# Patient Record
Sex: Male | Born: 1965
Health system: Southern US, Community
[De-identification: ages and names within clinical notes are randomized; demographics above are authoritative.]

## PROBLEM LIST (undated history)

## (undated) DIAGNOSIS — M1990 Unspecified osteoarthritis, unspecified site: Secondary | ICD-10-CM

## (undated) DIAGNOSIS — K219 Gastro-esophageal reflux disease without esophagitis: Secondary | ICD-10-CM

## (undated) DIAGNOSIS — IMO0002 Reserved for concepts with insufficient information to code with codable children: Secondary | ICD-10-CM

## (undated) DIAGNOSIS — R0602 Shortness of breath: Secondary | ICD-10-CM

## (undated) DIAGNOSIS — I10 Essential (primary) hypertension: Secondary | ICD-10-CM

## (undated) DIAGNOSIS — G4730 Sleep apnea, unspecified: Secondary | ICD-10-CM

## (undated) DIAGNOSIS — T8859XA Other complications of anesthesia, initial encounter: Secondary | ICD-10-CM

## (undated) DIAGNOSIS — T4145XA Adverse effect of unspecified anesthetic, initial encounter: Secondary | ICD-10-CM

## (undated) HISTORY — PX: HERNIA REPAIR: SHX51

## (undated) HISTORY — PX: OTHER SURGICAL HISTORY: SHX169

## (undated) HISTORY — PX: LUMBAR SPINE SURGERY: SHX701

## (undated) HISTORY — PX: PELVIC ABCESS DRAINAGE: SHX2189

## (undated) HISTORY — PX: TONSILLECTOMY: SUR1361

---

## 2001-05-02 ENCOUNTER — Ambulatory Visit (HOSPITAL_COMMUNITY): Admission: RE | Admit: 2001-05-02 | Discharge: 2001-05-02 | Payer: Self-pay | Admitting: General Surgery

## 2012-01-18 ENCOUNTER — Other Ambulatory Visit (HOSPITAL_COMMUNITY): Payer: Self-pay | Admitting: Internal Medicine

## 2012-01-18 DIAGNOSIS — R1013 Epigastric pain: Secondary | ICD-10-CM

## 2012-01-20 ENCOUNTER — Other Ambulatory Visit (HOSPITAL_COMMUNITY): Payer: Self-pay

## 2012-01-25 ENCOUNTER — Ambulatory Visit (HOSPITAL_COMMUNITY)
Admission: RE | Admit: 2012-01-25 | Discharge: 2012-01-25 | Disposition: A | Discharge status: Home / Self Care | Payer: PRIVATE HEALTH INSURANCE | Source: Ambulatory Visit | Attending: Internal Medicine | Admitting: Internal Medicine

## 2012-01-25 DIAGNOSIS — R1013 Epigastric pain: Secondary | ICD-10-CM

## 2012-01-25 DIAGNOSIS — K838 Other specified diseases of biliary tract: Secondary | ICD-10-CM | POA: Insufficient documentation

## 2012-02-16 ENCOUNTER — Other Ambulatory Visit (HOSPITAL_COMMUNITY): Payer: Self-pay | Admitting: General Surgery

## 2012-02-16 DIAGNOSIS — R110 Nausea: Secondary | ICD-10-CM

## 2012-02-16 DIAGNOSIS — K828 Other specified diseases of gallbladder: Secondary | ICD-10-CM

## 2012-02-18 ENCOUNTER — Encounter (HOSPITAL_COMMUNITY): Payer: Self-pay

## 2012-02-18 ENCOUNTER — Encounter (HOSPITAL_COMMUNITY)
Admission: RE | Admit: 2012-02-18 | Discharge: 2012-02-18 | Disposition: A | Discharge status: Home / Self Care | Payer: PRIVATE HEALTH INSURANCE | Source: Ambulatory Visit | Attending: General Surgery | Admitting: General Surgery

## 2012-02-18 DIAGNOSIS — R110 Nausea: Secondary | ICD-10-CM | POA: Insufficient documentation

## 2012-02-18 DIAGNOSIS — K828 Other specified diseases of gallbladder: Secondary | ICD-10-CM

## 2012-02-18 DIAGNOSIS — K838 Other specified diseases of biliary tract: Secondary | ICD-10-CM | POA: Insufficient documentation

## 2012-02-18 MED ORDER — TECHNETIUM TC 99M MEBROFENIN IV KIT
5.0000 | PACK | Freq: Once | INTRAVENOUS | Status: AC | PRN
  Administered 2012-02-18: 5.4 via INTRAVENOUS

## 2012-02-18 MED ORDER — SINCALIDE 5 MCG IJ SOLR
INTRAMUSCULAR | Status: AC
  Administered 2012-02-18: 1.97 ug
  Filled 2012-02-18: qty 5

## 2012-06-01 ENCOUNTER — Other Ambulatory Visit: Payer: Self-pay | Admitting: Internal Medicine

## 2012-06-01 ENCOUNTER — Ambulatory Visit
Admission: RE | Admit: 2012-06-01 | Discharge: 2012-06-01 | Disposition: A | Discharge status: Home / Self Care | Payer: Worker's Compensation | Source: Ambulatory Visit | Attending: Internal Medicine | Admitting: Internal Medicine

## 2012-06-01 ENCOUNTER — Ambulatory Visit
Admission: RE | Admit: 2012-06-01 | Discharge: 2012-06-01 | Disposition: A | Discharge status: Home / Self Care | Payer: PRIVATE HEALTH INSURANCE | Source: Ambulatory Visit | Attending: Internal Medicine | Admitting: Internal Medicine

## 2012-06-01 DIAGNOSIS — M549 Dorsalgia, unspecified: Secondary | ICD-10-CM

## 2012-08-24 ENCOUNTER — Encounter (HOSPITAL_COMMUNITY): Payer: Self-pay | Admitting: Emergency Medicine

## 2012-08-24 ENCOUNTER — Emergency Department (HOSPITAL_COMMUNITY)
Admission: EM | Admit: 2012-08-24 | Discharge: 2012-08-25 | Disposition: A | Discharge status: Home / Self Care | Payer: PRIVATE HEALTH INSURANCE | Attending: Emergency Medicine | Admitting: Emergency Medicine

## 2012-08-24 DIAGNOSIS — R1013 Epigastric pain: Secondary | ICD-10-CM | POA: Insufficient documentation

## 2012-08-24 DIAGNOSIS — F17200 Nicotine dependence, unspecified, uncomplicated: Secondary | ICD-10-CM | POA: Insufficient documentation

## 2012-08-24 DIAGNOSIS — R42 Dizziness and giddiness: Secondary | ICD-10-CM | POA: Insufficient documentation

## 2012-08-24 HISTORY — DX: Reserved for concepts with insufficient information to code with codable children: IMO0002

## 2012-08-24 NOTE — ED Notes (Signed)
Patient states he may be having an allergic reaction to something; states he feels like his body is burning/flushing all over.  States has had this intermittently for about a year.

## 2012-08-24 NOTE — ED Notes (Signed)
States the epigastric pain did not begin until about one hour ago.  Came for tx of general head to toe flush, now having epigastric discomfort.  States he had a gall bladder work up by his doctor, as he has this type abdominal pain every few months but his Dr told him his gall bladder looked ok.   Has made frequent trips to bathroom because he drank about 50 ounces of water prior to arrival and keeps having to void.

## 2012-08-25 ENCOUNTER — Emergency Department (HOSPITAL_COMMUNITY): Payer: PRIVATE HEALTH INSURANCE

## 2012-08-25 LAB — CBC WITH DIFFERENTIAL/PLATELET
Basophils Relative: 0 % (ref 0–1)
Eosinophils Absolute: 0.1 10*3/uL (ref 0.0–0.7)
Eosinophils Relative: 0 % (ref 0–5)
Lymphs Abs: 3.5 10*3/uL (ref 0.7–4.0)
MCH: 30.5 pg (ref 26.0–34.0)
MCHC: 35.2 g/dL (ref 30.0–36.0)
MCV: 86.6 fL (ref 78.0–100.0)
Monocytes Relative: 4 % (ref 3–12)
Neutrophils Relative %: 66 % (ref 43–77)
Platelets: 203 10*3/uL (ref 150–400)

## 2012-08-25 LAB — COMPREHENSIVE METABOLIC PANEL
ALT: 23 U/L (ref 0–53)
Alkaline Phosphatase: 85 U/L (ref 39–117)
BUN: 16 mg/dL (ref 6–23)
CO2: 27 mEq/L (ref 19–32)
Chloride: 104 mEq/L (ref 96–112)
GFR calc Af Amer: >90 mL/min (ref >90)
Glucose, Bld: 106 mg/dL — ABNORMAL HIGH (ref 70–99)
Potassium: 4.1 mEq/L (ref 3.5–5.1)
Sodium: 138 mEq/L (ref 135–145)
Total Bilirubin: 0.3 mg/dL (ref 0.3–1.2)

## 2012-08-25 LAB — TROPONIN I: Troponin I: <0.3 ng/mL (ref <0.30)

## 2012-08-25 LAB — LIPASE, BLOOD: Lipase: 24 U/L (ref 11–59)

## 2012-08-25 MED ORDER — SODIUM CHLORIDE 0.9 % IV BOLUS (SEPSIS)
250.0000 mL | Freq: Once | INTRAVENOUS | Status: AC
  Administered 2012-08-25: 250 mL via INTRAVENOUS

## 2012-08-25 MED ORDER — ONDANSETRON HCL 4 MG/2ML IJ SOLN
4.0000 mg | Freq: Once | INTRAMUSCULAR | Status: AC
  Administered 2012-08-25: 4 mg via INTRAVENOUS
  Filled 2012-08-25: qty 2

## 2012-08-25 MED ORDER — HYDROMORPHONE HCL PF 1 MG/ML IJ SOLN
1.0000 mg | Freq: Once | INTRAMUSCULAR | Status: AC
  Administered 2012-08-25: 1 mg via INTRAVENOUS
  Filled 2012-08-25: qty 1

## 2012-08-25 MED ORDER — ASPIRIN 81 MG PO CHEW
324.0000 mg | CHEWABLE_TABLET | Freq: Once | ORAL | Status: AC
  Administered 2012-08-25: 324 mg via ORAL
  Filled 2012-08-25: qty 4

## 2012-08-25 MED ORDER — SODIUM CHLORIDE 0.9 % IV SOLN: INTRAVENOUS | Status: DC

## 2012-08-25 NOTE — ED Provider Notes (Signed)
History     CSN: [id number]  Arrival date & time 08/24/12  2219   First MD Initiated Contact with Patient 08/25/12 0031      Chief Complaint  Patient presents with  . Illness    (Consider location/radiation/quality/duration/timing/severity/associated sxs/prior treatment) The history is provided by the patient.  patient is a 46 year old male that presents with onset at 43 PM of a warm flushing sensation epigastric abdominal pain some lightheadedness or dizziness. Patient now feeling better but still has some epigastric discomfort. Patient's had an extensive workup in the past for gallbladder disease which was negative for gallstones. Currently epigastric abdominal pain is about a 3-4/10 described as kind of a soreness.  Past Medical History  Diagnosis Date  . Bulging disc     Past Surgical History  Procedure Date  . Hernia repair     No family history on file.  History  Substance Use Topics  . Smoking status: Current Everyday Smoker  . Smokeless tobacco: Not on file  . Alcohol Use: No      Review of Systems  Constitutional: Negative for fever.  HENT: Negative for neck pain.   Respiratory: Negative for shortness of breath.   Cardiovascular: Positive for chest pain.  Gastrointestinal: Positive for abdominal pain. Negative for nausea, vomiting and diarrhea.  Genitourinary: Negative for hematuria.  Musculoskeletal: Negative for back pain.  Skin: Negative for rash.  Neurological: Positive for dizziness and light-headedness. Negative for speech difficulty, weakness, numbness and headaches.  Hematological: Does not bruise/bleed easily.    Allergies  Review of patient's allergies indicates no known allergies.  Home Medications   Current Outpatient Rx  Name Route Sig Dispense Refill  . AMLODIPINE BESYLATE 5 MG PO TABS Oral Take 5 mg by mouth daily.    . ASPIRIN 325 MG PO TABS Oral Take 325 mg by mouth daily.    Marland Kitchen ESOMEPRAZOLE MAGNESIUM 40 MG PO CPDR Oral Take 40 mg by  mouth daily before breakfast.    . HYDROCODONE-ACETAMINOPHEN 10-325 MG PO TABS Oral Take 1 tablet by mouth every 6 (six) hours as needed. For pain    . FISH OIL BURP-LESS PO Oral Take 1,400 mg by mouth 3 (three) times daily.      BP 137/75  Pulse 89  Temp 97.6 F (36.4 C) (Oral)  Resp 18  Ht 5\' 10"  (1.778 m)  Wt 215 lb (97.523 kg)  BMI 30.85 kg/m2  SpO2 95%  Physical Exam  Nursing note and vitals reviewed. Constitutional: He is oriented to person, place, and time. He appears well-developed and well-nourished.  HENT:  Head: Normocephalic and atraumatic.  Eyes: Conjunctivae and EOM are normal. Pupils are equal, round, and reactive to light.  Neck: Normal range of motion. Neck supple.  Cardiovascular: Normal rate, regular rhythm and normal heart sounds.   No murmur heard. Pulmonary/Chest: Effort normal and breath sounds normal.  Abdominal: Soft. Bowel sounds are normal. There is no tenderness.  Musculoskeletal: Normal range of motion. He exhibits no edema and no tenderness.  Neurological: He is alert and oriented to person, place, and time. No cranial nerve deficit. He exhibits normal muscle tone. Coordination normal.  Skin: No rash noted.    ED Course  Procedures (including critical care time)  Labs Reviewed  COMPREHENSIVE METABOLIC PANEL - Abnormal; Notable for the following:    Glucose, Bld 106 (*)     All other components within normal limits  CBC WITH DIFFERENTIAL - Abnormal; Notable for the following:    WBC  11.9 (*)     Neutro Abs 7.9 (*)     All other components within normal limits  LIPASE, BLOOD  TROPONIN I  TROPONIN I   Dg Chest 2 View  08/25/2012  *RADIOLOGY REPORT*  Clinical Data: Epigastric pain with flushing.  CHEST - 2 VIEW  Comparison: None.  Findings: There is mild motion artifact on the lateral view. The heart size and mediastinal contours are normal. The lungs are clear. There is no pleural effusion or pneumothorax. No acute osseous findings are  identified.  IMPRESSION: No active cardiopulmonary process.   Original Report Authenticated By: Gerrianne Scale, M.D.     Date: 08/25/2012  Rate: 75  Rhythm: normal sinus rhythm  QRS Axis: normal  Intervals: normal  ST/T Wave abnormalities: normal  Conduction Disutrbances:none  Narrative Interpretation:   Old EKG Reviewed: none available  Results for orders placed during the hospital encounter of 08/24/12  COMPREHENSIVE METABOLIC PANEL      Component Value Range   Sodium 138  135 - 145 mEq/L   Potassium 4.1  3.5 - 5.1 mEq/L   Chloride 104  96 - 112 mEq/L   CO2 27  19 - 32 mEq/L   Glucose, Bld 106 (*) 70 - 99 mg/dL   BUN 16  6 - 23 mg/dL   Creatinine, Ser 1.91  0.50 - 1.35 mg/dL   Calcium 9.8  8.4 - 47.8 mg/dL   Total Protein 7.2  6.0 - 8.3 g/dL   Albumin 4.0  3.5 - 5.2 g/dL   AST 17  0 - 37 U/L   ALT 23  0 - 53 U/L   Alkaline Phosphatase 85  39 - 117 U/L   Total Bilirubin 0.3  0.3 - 1.2 mg/dL   GFR calc non Af Amer >90  >90 mL/min   GFR calc Af Amer >90  >90 mL/min  LIPASE, BLOOD      Component Value Range   Lipase 24  11 - 59 U/L  TROPONIN I      Component Value Range   Troponin I <0.30  <0.30 ng/mL  CBC WITH DIFFERENTIAL      Component Value Range   WBC 11.9 (*) 4.0 - 10.5 K/uL   RBC 4.79  4.22 - 5.81 MIL/uL   Hemoglobin 14.6  13.0 - 17.0 g/dL   HCT 29.5  62.1 - 30.8 %   MCV 86.6  78.0 - 100.0 fL   MCH 30.5  26.0 - 34.0 pg   MCHC 35.2  30.0 - 36.0 g/dL   RDW 65.7  84.6 - 96.2 %   Platelets 203  150 - 400 K/uL   Neutrophils Relative 66  43 - 77 %   Neutro Abs 7.9 (*) 1.7 - 7.7 K/uL   Lymphocytes Relative 29  12 - 46 %   Lymphs Abs 3.5  0.7 - 4.0 K/uL   Monocytes Relative 4  3 - 12 %   Monocytes Absolute 0.5  0.1 - 1.0 K/uL   Eosinophils Relative 0  0 - 5 %   Eosinophils Absolute 0.1  0.0 - 0.7 K/uL   Basophils Relative 0  0 - 1 %   Basophils Absolute 0.0  0.0 - 0.1 K/uL  TROPONIN I      Component Value Range   Troponin I <0.30  <0.30 ng/mL     1.  Epigastric abdominal pain       MDM  Patient presented with the flushing sensation located dizziness epigastric abdominal  pain started about 9:00 PM. Symptoms were improving upon arrival to the emergency department. Workup negative EKG without acute changes chest x-ray without pneumonia or pneumothorax troponin is x2 not consistent with an acute cardiac event no significant liver function tests and lipase was not consistent with pancreatitis. Patient had similar events in the past with pretty extensive gallbladder workup with no specific findings for gallstones then no evidence today of acute cholecystitis taste on exam or labs since she's had negative gallstone workup in the past unlikely that this gallstones now.   Patient will followup with his primary care Dr.        Shelda Jakes, MD 08/25/12 [phone]

## 2012-08-29 ENCOUNTER — Other Ambulatory Visit (HOSPITAL_COMMUNITY): Payer: Self-pay | Admitting: Internal Medicine

## 2012-08-29 DIAGNOSIS — R1013 Epigastric pain: Secondary | ICD-10-CM

## 2012-08-31 ENCOUNTER — Other Ambulatory Visit (HOSPITAL_COMMUNITY): Payer: Self-pay

## 2012-09-01 ENCOUNTER — Ambulatory Visit (HOSPITAL_COMMUNITY): Admission: RE | Admit: 2012-09-01 | Payer: PRIVATE HEALTH INSURANCE | Source: Ambulatory Visit

## 2012-09-09 ENCOUNTER — Ambulatory Visit (HOSPITAL_COMMUNITY)
Admission: RE | Admit: 2012-09-09 | Discharge: 2012-09-09 | Disposition: A | Discharge status: Home / Self Care | Payer: PRIVATE HEALTH INSURANCE | Source: Ambulatory Visit | Attending: Internal Medicine | Admitting: Internal Medicine

## 2012-09-09 ENCOUNTER — Other Ambulatory Visit (HOSPITAL_COMMUNITY): Payer: Self-pay

## 2012-09-09 ENCOUNTER — Encounter (HOSPITAL_COMMUNITY): Payer: Self-pay

## 2012-09-09 DIAGNOSIS — R1013 Epigastric pain: Secondary | ICD-10-CM

## 2012-09-09 MED ORDER — IOHEXOL 300 MG/ML  SOLN
100.0000 mL | Freq: Once | INTRAMUSCULAR | Status: AC | PRN
  Administered 2012-09-09: 100 mL via INTRAVENOUS

## 2012-09-29 ENCOUNTER — Encounter (HOSPITAL_COMMUNITY): Payer: Self-pay | Admitting: Pharmacy Technician

## 2012-09-29 NOTE — H&P (Signed)
  NTS SOAP Note  Vital Signs:  Vitals as of: 09/29/2012: Systolic 147: Diastolic 80: Heart Rate 84: Temp 98.59F: Height 16ft 10in: Weight 205Lbs 0 Ounces: Pain Level 8: BMI 29  BMI : 29.41 kg/m2  Subjective: This 46 Years 52 Months old Male presents for of epigastric pain.  Has been present intermittently for the past month.  Is spontaneous, comes and goes.  Difficult to know if it is associated with anything food.  + nausea.  Has had CT scan of abdomen (negative), HIDA which was negative, U/S of gallbladder which shows sludge.  ER labs unremarkable.  Review of Symptoms:  Constitutional:  fever,chills Head:unremarkable    Eyes:  blurred vision bilateral Nose/Mouth/Throat:unremarkable Cardiovascular:  unremarkable   Respiratory:  dyspnea Gastrointestin    abdominal pain,nausea,heartburn,dyspepsia Genitourinary:unremarkable       joint, back, neck pain Skin:unremarkable Hematolgic/Lymphatic:unremarkable     Allergic/Immunologic:unremarkable     Past Medical History:    Reviewed   Past Medical History  Surgical History: ingunal herniorrhaphy Medical Problems: HTN, reflux Allergies: nkda Medications: nexium, amlodipine, hydrocodone   Social History:Reviewed  Social History  Preferred Language: English ([place]) Race:  White Ethnicity: Not Hispanic / Latino Age: 46 Years 7 Months Marital Status:  M Alcohol: unknown Recreational drug(s):  No   Smoking Status: Current every day smoker reviewed on 09/29/2012 Started Date: 12/21/1981 Packs per day: 2.00   Family History:  Reviewed   Family History  Is there a family history UJ:WJXBJY, CAD, DM    Objective Information: General:  Well appearing, well nourished in no distress.   no scleral icterus Neck:  Supple without lymphadenopathy.  Heart:  RRR, no murmur or gallop.  Normal S1, S2.  No S3, S4.  Lungs:    CTA bilaterally, no wheezes, rhonchi,  rales.  Breathing unlabored. Abdomen:Soft, NT/ND, no HSM, no masses.  Assessment:Epigastric pain of unknown etiology  Diagnosis &amp; Procedure: DiagnosisCode: 789.06, ProcedureCode: 78295,    Plan:Scheduled for EGD on 10/04/12.  If this is negative, may need cholecystectomy.  Patient understands and agrees to treatment plan.   Patient Education:Alternative treatments to surgery were discussed with patient (and family).  Risks and benefits  of procedure were fully explained to the patient (and family) who gave informed consent. Patient/family questions were addressed.  Follow-up:Pending Surgery

## 2012-10-03 MED ORDER — SODIUM CHLORIDE 0.9 % IV SOLN: INTRAVENOUS | Status: DC

## 2012-10-03 MED ORDER — SODIUM CHLORIDE 0.45 % IV SOLN
INTRAVENOUS | Status: DC
  Administered 2012-10-04: 09:00:00 via INTRAVENOUS

## 2012-10-04 ENCOUNTER — Ambulatory Visit (HOSPITAL_COMMUNITY)
Admission: RE | Admit: 2012-10-04 | Discharge: 2012-10-04 | Disposition: A | Discharge status: Home / Self Care | Payer: PRIVATE HEALTH INSURANCE | Source: Ambulatory Visit | Attending: General Surgery | Admitting: General Surgery

## 2012-10-04 ENCOUNTER — Encounter (HOSPITAL_COMMUNITY): Payer: Self-pay | Admitting: *Deleted

## 2012-10-04 ENCOUNTER — Encounter (HOSPITAL_COMMUNITY)
Admission: RE | Disposition: A | Discharge status: Home / Self Care | Payer: Self-pay | Source: Ambulatory Visit | Attending: General Surgery

## 2012-10-04 DIAGNOSIS — R1013 Epigastric pain: Secondary | ICD-10-CM | POA: Insufficient documentation

## 2012-10-04 DIAGNOSIS — K3189 Other diseases of stomach and duodenum: Secondary | ICD-10-CM | POA: Insufficient documentation

## 2012-10-04 DIAGNOSIS — K2990 Gastroduodenitis, unspecified, without bleeding: Secondary | ICD-10-CM | POA: Insufficient documentation

## 2012-10-04 DIAGNOSIS — K2970 Gastritis, unspecified, without bleeding: Secondary | ICD-10-CM | POA: Insufficient documentation

## 2012-10-04 DIAGNOSIS — I10 Essential (primary) hypertension: Secondary | ICD-10-CM | POA: Insufficient documentation

## 2012-10-04 HISTORY — DX: Unspecified osteoarthritis, unspecified site: M19.90

## 2012-10-04 HISTORY — DX: Shortness of breath: R06.02

## 2012-10-04 HISTORY — DX: Essential (primary) hypertension: I10

## 2012-10-04 HISTORY — PX: ESOPHAGOGASTRODUODENOSCOPY: SHX5428

## 2012-10-04 SURGERY — EGD (ESOPHAGOGASTRODUODENOSCOPY)
Anesthesia: Moderate Sedation

## 2012-10-04 MED ORDER — STERILE WATER FOR IRRIGATION IR SOLN
Status: DC | PRN
  Administered 2012-10-04: 09:00:00

## 2012-10-04 MED ORDER — MIDAZOLAM HCL 5 MG/5ML IJ SOLN
INTRAMUSCULAR | Status: DC | PRN
  Administered 2012-10-04: 1 mg via INTRAVENOUS
  Administered 2012-10-04: 3 mg via INTRAVENOUS

## 2012-10-04 MED ORDER — MEPERIDINE HCL 100 MG/ML IJ SOLN
INTRAMUSCULAR | Status: AC
  Filled 2012-10-04: qty 1

## 2012-10-04 MED ORDER — BUTAMBEN-TETRACAINE-BENZOCAINE 2-2-14 % EX AERO
INHALATION_SPRAY | CUTANEOUS | Status: DC | PRN
  Administered 2012-10-04: 2 via TOPICAL

## 2012-10-04 MED ORDER — MEPERIDINE HCL 25 MG/ML IJ SOLN
INTRAMUSCULAR | Status: DC | PRN
  Administered 2012-10-04: 50 mg via INTRAVENOUS

## 2012-10-04 MED ORDER — MIDAZOLAM HCL 5 MG/5ML IJ SOLN
INTRAMUSCULAR | Status: AC
  Filled 2012-10-04: qty 5

## 2012-10-04 NOTE — Op Note (Signed)
[facility] [address] [place], [address]   ENDOSCOPY PROCEDURE REPORT  PATIENT: Tony Meadows, Tony Meadows  MR#: [id number] BIRTHDATE: 10/06/1966 , 46  yrs. old GENDER: Male ENDOSCOPIST: Franky Macho, MD REFERRED BY:  Artis Delay PROCEDURE DATE:  10/04/2012 PROCEDURE:  EGD w/ biopsy for H.pylori ASA CLASS:     Class II INDICATIONS:  dyspepsia. MEDICATIONS: Versed 4 mg IV and Demerol 50 mg IV TOPICAL ANESTHETIC: Cetacaine Spray  DESCRIPTION OF PROCEDURE: After the risks benefits and alternatives of the procedure were thoroughly explained, informed consent was obtained.  The EG-[id number] ([id number]) endoscope was introduced through the mouth and advanced to the second portion of the duodenum. Without limitations.  The instrument was slowly withdrawn as the mucosa was fully examined.    The scope was advanced to the second portion of the duodenum without difficulty.  First and second portions of the duodenum without limits.  The pylorus was noted to be widely patent.  There was evidence of streaking: Some gastritis in the prepyloric region. There was no gross ulcerations seen.  Prepyloric mucosal biopsy was taken for a CLOtest.  The stomach was easily distensible.  On retroflexion of the endoscope, no evidence of a hiatal hernia was noted.  The Z line was noted be a 40 cm from the teeth.  The esophagus was within normal limits.  No evidence of Barrett's esophagitis or stricture were noted.         The scope was then withdrawn from the patient and the procedure completed.  COMPLICATIONS: There were no complications. ENDOSCOPIC IMPRESSION:gastritis, moderate  RECOMMENDATIONS: CLOtest pending. Would continue PPI for now. REPEAT EXAM:  eSigned:  Franky Macho, MD 10/04/2012 9:37 AM   CC:

## 2012-10-04 NOTE — Interval H&P Note (Signed)
History and Physical Interval Note:  10/04/2012 9:18 AM  Tony Meadows  has presented today for surgery, with the diagnosis of Epigastric pain   The various methods of treatment have been discussed with the patient and family. After consideration of risks, benefits and other options for treatment, the patient has consented to  Procedure(s) (LRB) with comments: ESOPHAGOGASTRODUODENOSCOPY (EGD) (N/A) as a surgical intervention .  The patient's history has been reviewed, patient examined, no change in status, stable for surgery.  I have reviewed the patient's chart and labs.  Questions were answered to the patient's satisfaction.     Franky Macho A

## 2012-10-05 LAB — CLOTEST (H. PYLORI), BIOPSY: Helicobacter screen: NEGATIVE

## 2012-10-06 ENCOUNTER — Encounter (HOSPITAL_COMMUNITY): Payer: Self-pay | Admitting: Pharmacy Technician

## 2012-10-06 NOTE — H&P (Signed)
  NTS SOAP Note  Vital Signs:  Vitals as of: 09/29/2012: Systolic 147: Diastolic 80: Heart Rate 84: Temp 98.60F: Height 70ft 10in: Weight 205Lbs 0 Ounces: Pain Level 8: BMI 29  BMI : 29.41 kg/m2  Subjective: This 72 Years 109 Months old Male presents for of epigastric pain.  Has been present intermittently for the past month.  Is spontaneous, comes and goes.  Difficult to know if it is associated with anything food.  + nausea.  Has had CT scan of abdomen (negative), HIDA which was negative, U/S of gallbladder which shows sludge.  EGD showed only mild gastritis.  H. Pylori negative.  Review of Symptoms:  Constitutional:  fever,chills Head:unremarkable    Eyes:  blurred vision bilateral Nose/Mouth/Throat:unremarkable Cardiovascular:  unremarkable   Respiratory:  dyspnea Gastrointestin    abdominal pain,nausea,heartburn,dyspepsia Genitourinary:unremarkable       joint, back, neck pain Skin:unremarkable Hematolgic/Lymphatic:unremarkable     Allergic/Immunologic:unremarkable     Past Medical History:    Reviewed   Past Medical History  Surgical History: ingunal herniorrhaphy Medical Problems: HTN, reflux Allergies: nkda Medications: nexium, amlodipine, hydrocodone   Social History:Reviewed  Social History  Preferred Language: English ([place]) Race:  White Ethnicity: Not Hispanic / Latino Age: 12 Years 7 Months Marital Status:  M Alcohol: unknown Recreational drug(s):  No   Smoking Status: Current every day smoker reviewed on 09/29/2012 Started Date: 12/21/1981 Packs per day: 2.00   Family History:  Reviewed   Family History  Is there a family history EX:BMWUXL, CAD, DM    Objective Information: General:  Well appearing, well nourished in no distress.   no scleral icterus Neck:  Supple without lymphadenopathy.  Heart:  RRR, no murmur or gallop.  Normal S1, S2.  No S3, S4.  Lungs:    CTA  bilaterally, no wheezes, rhonchi, rales.  Breathing unlabored. Abdomen:Soft, NT/ND, no HSM, no masses.  Assessment:Epigastric pain of unknown etiology  Diagnosis &amp; Procedure:    Plan:Scheduled for Laparoscopic cholecystectomy on 10/12/12.  Patient Education:Alternative treatments to surgery were discussed with patient (and family).  Risks and benefits  of procedure were fully explained to the patient (and family) who gave informed consent. Patient/family questions were addressed.  Follow-up:Pending Surgery

## 2012-10-07 ENCOUNTER — Encounter (HOSPITAL_COMMUNITY): Payer: Self-pay

## 2012-10-07 ENCOUNTER — Encounter (HOSPITAL_COMMUNITY)
Admission: RE | Admit: 2012-10-07 | Discharge: 2012-10-07 | Disposition: A | Discharge status: Home / Self Care | Payer: PRIVATE HEALTH INSURANCE | Source: Ambulatory Visit | Attending: General Surgery | Admitting: General Surgery

## 2012-10-07 HISTORY — DX: Sleep apnea, unspecified: G47.30

## 2012-10-07 HISTORY — DX: Other complications of anesthesia, initial encounter: T88.59XA

## 2012-10-07 HISTORY — DX: Gastro-esophageal reflux disease without esophagitis: K21.9

## 2012-10-07 HISTORY — DX: Adverse effect of unspecified anesthetic, initial encounter: T41.45XA

## 2012-10-07 LAB — BASIC METABOLIC PANEL
BUN: 11 mg/dL (ref 6–23)
Chloride: 100 mEq/L (ref 96–112)
Creatinine, Ser: 0.8 mg/dL (ref 0.50–1.35)
GFR calc Af Amer: >90 mL/min (ref >90)
GFR calc non Af Amer: >90 mL/min (ref >90)
Potassium: 4.1 mEq/L (ref 3.5–5.1)

## 2012-10-07 LAB — HEMOGLOBIN AND HEMATOCRIT, BLOOD: Hemoglobin: 16 g/dL (ref 13.0–17.0)

## 2012-10-07 LAB — SURGICAL PCR SCREEN: Staphylococcus aureus: POSITIVE — AB

## 2012-10-07 NOTE — Patient Instructions (Signed)
18 Tony Meadows  10/07/2012   Your procedure is scheduled on:  Wednesday, 10/12/12  Report to [facility] at 0920 AM.  Call this number if you have problems the morning of surgery: [phone]   Remember:   Do not eat food:After Midnight.  May have clear liquids:until Midnight .  Clear liquids include soda, tea, black coffee, apple or grape juice, broth.  Take these medicines the morning of surgery with A SIP OF WATER: norvasc, nexium, norco   Do not wear jewelry, make-up or nail polish.  Do not wear lotions, powders, or perfumes. You may wear deodorant.  Do not shave 48 hours prior to surgery. Men may shave face and neck.  Do not bring valuables to the hospital.  Contacts, dentures or bridgework may not be worn into surgery.  Leave suitcase in the car. After surgery it may be brought to your room.  For patients admitted to the hospital, checkout time is 11:00 AM the day of discharge.   Patients discharged the day of surgery will not be allowed to drive home.  Name and phone number of your driver: family  Special Instructions: Shower using CHG 2 nights before surgery and the night before surgery.  If you shower the day of surgery use CHG.  Use special wash - you have one bottle of CHG for all showers.  You should use approximately 1/3 of the bottle for each shower.   Please read over the following fact sheets that you were given: Pain Booklet, Coughing and Deep Breathing, MRSA Information, Surgical Site Infection Prevention, Anesthesia Post-op Instructions and Care and Recovery After Surgery   Laparoscopic Cholecystectomy Care After These instructions give you information on caring for yourself after your procedure. Your doctor may also give you more specific instructions. Call your doctor if you have any problems or questions after your procedure. HOME CARE  Change your bandages (dressings) as told by your doctor.  Keep the wound dry and clean. Wash the wound gently with soap and  water. Pat the wound dry with a clean towel.  Do not take baths, swim, or use hot tubs for 10 days, or as told by your doctor.  Only take medicine as told by your doctor.  Eat a normal diet as told by your doctor.  Do not lift anything heavier than 25 pounds (11.5 kg), or as told by your doctor.  Do not play contact sports for 1 week, or as told by your doctor. GET HELP RIGHT AWAY IF:   Your wound is red, puffy (swollen), or painful.  You have yellowish-white fluid (pus) coming from the wound.  You have fluid draining from the wound for more than 1 day.  You have a bad smell coming from the wound.  Your wound breaks open.  You have a rash.  You have trouble breathing.  You have chest pain.  You have a bad reaction to your medicine.  You have a fever.  You have pain in the shoulders (shoulder strap areas).  You feel dizzy or pass out (faint).  You have severe belly (abdominal) pain.  You feel sick to your stomach (nauseous) or throw up (vomit) for more than 1 day. MAKE SURE YOU:  Understand these instructions.  Will watch your condition.  Will get help right away if you are not doing well or get worse. Document Released: 09/15/2008 Document Revised: 02/29/2012 Document Reviewed: 05/26/2011 [facility] Patient Information 2013 [place], [place]. Laparoscopic Cholecystectomy Laparoscopic cholecystectomy is surgery to remove the  gallbladder. The gallbladder is located slightly to the right of center in the abdomen, behind the liver. It is a concentrating and storage sac for the bile produced in the liver. Bile aids in the digestion and absorption of fats. Gallbladder disease (cholecystitis) is an inflammation of your gallbladder. This condition is usually caused by a buildup of gallstones (cholelithiasis) in your gallbladder. Gallstones can block the flow of bile, resulting in inflammation and pain. In severe cases, emergency surgery may be required. When emergency surgery is  not required, you will have time to prepare for the procedure. Laparoscopic surgery is an alternative to open surgery. Laparoscopic surgery usually has a shorter recovery time. Your common bile duct may also need to be examined and explored. Your caregiver will discuss this with you if he or she feels this should be done. If stones are found in the common bile duct, they may be removed. LET YOUR CAREGIVER KNOW ABOUT:  Allergies to food or medicine.  Medicines taken, including vitamins, herbs, eyedrops, over-the-counter medicines, and creams.  Use of steroids (by mouth or creams).  Previous problems with anesthetics or numbing medicines.  History of bleeding problems or blood clots.  Previous surgery.  Other health problems, including diabetes and kidney problems.  Possibility of pregnancy, if this applies. RISKS AND COMPLICATIONS All surgery is associated with risks. Some problems that may occur following this procedure include:  Infection.  Damage to the common bile duct, nerves, arteries, veins, or other internal organs such as the stomach or intestines.  Bleeding.  A stone may remain in the common bile duct. BEFORE THE PROCEDURE  Do not take aspirin for 3 days prior to surgery or blood thinners for 1 week prior to surgery.  Do not eat or drink anything after midnight the night before surgery.  Let your caregiver know if you develop a cold or other infectious problem prior to surgery.  You should be present 60 minutes before the procedure or as directed. PROCEDURE  You will be given medicine that makes you sleep (general anesthetic). When you are asleep, your surgeon will make several small cuts (incisions) in your abdomen. One of these incisions is used to insert a small, lighted scope (laparoscope) into the abdomen. The laparoscope helps the surgeon see into your abdomen. Carbon dioxide gas will be pumped into your abdomen. The gas allows more room for the surgeon to  perform your surgery. Other operating instruments are inserted through the other incisions. Laparoscopic procedures may not be appropriate when:  There is major scarring from previous surgery.  The gallbladder is extremely inflamed.  There are bleeding disorders or unexpected cirrhosis of the liver.  A pregnancy is near term.  Other conditions make the laparoscopic procedure impossible. If your surgeon feels it is not safe to continue with a laparoscopic procedure, he or she will perform an open abdominal procedure. In this case, the surgeon will make an incision to open the abdomen. This gives the surgeon a larger view and field to work within. This may allow the surgeon to perform procedures that sometimes cannot be performed with a laparoscope alone. Open surgery has a longer recovery time. AFTER THE PROCEDURE  You will be taken to the recovery area where a nurse will watch and check your progress.  You may be allowed to go home the same day.  Do not resume physical activities until directed by your caregiver.  You may resume a normal diet and activities as directed. Document Released: 12/07/2005 Document Revised:  02/29/2012 Document Reviewed: 05/22/2011 [employer] Patient Information 2013 [place], [place].

## 2012-10-07 NOTE — Progress Notes (Signed)
10/07/12 1040  OBSTRUCTIVE SLEEP APNEA  Have you ever been diagnosed with sleep apnea through a sleep study? No  Do you snore loudly (loud enough to be heard through closed doors)?  1  Do you often feel tired, fatigued, or sleepy during the daytime? 0  Has anyone observed you stop breathing during your sleep? 0  Do you have, or are you being treated for high blood pressure? 1  BMI more than 35 kg/m2? 0  Age over 48 years old? 0  Neck circumference greater than 40 cm/18 inches? 1  Gender: 1  Obstructive Sleep Apnea Score 4   Score 4 or greater  Results sent to PCP;No PCP

## 2012-10-12 ENCOUNTER — Ambulatory Visit (HOSPITAL_COMMUNITY): Payer: PRIVATE HEALTH INSURANCE | Admitting: Anesthesiology

## 2012-10-12 ENCOUNTER — Encounter (HOSPITAL_COMMUNITY)
Admission: RE | Disposition: A | Discharge status: Home / Self Care | Payer: Self-pay | Source: Ambulatory Visit | Attending: General Surgery

## 2012-10-12 ENCOUNTER — Encounter (HOSPITAL_COMMUNITY): Payer: Self-pay | Admitting: Anesthesiology

## 2012-10-12 ENCOUNTER — Encounter (HOSPITAL_COMMUNITY): Payer: Self-pay | Admitting: *Deleted

## 2012-10-12 ENCOUNTER — Ambulatory Visit (HOSPITAL_COMMUNITY)
Admission: RE | Admit: 2012-10-12 | Discharge: 2012-10-12 | Disposition: A | Discharge status: Home / Self Care | Payer: PRIVATE HEALTH INSURANCE | Source: Ambulatory Visit | Attending: General Surgery | Admitting: General Surgery

## 2012-10-12 DIAGNOSIS — K8020 Calculus of gallbladder without cholecystitis without obstruction: Secondary | ICD-10-CM | POA: Insufficient documentation

## 2012-10-12 DIAGNOSIS — I10 Essential (primary) hypertension: Secondary | ICD-10-CM | POA: Insufficient documentation

## 2012-10-12 DIAGNOSIS — Z01812 Encounter for preprocedural laboratory examination: Secondary | ICD-10-CM | POA: Insufficient documentation

## 2012-10-12 HISTORY — PX: CHOLECYSTECTOMY: SHX55

## 2012-10-12 SURGERY — LAPAROSCOPIC CHOLECYSTECTOMY
Anesthesia: General | Site: Abdomen | Wound class: Contaminated

## 2012-10-12 MED ORDER — ONDANSETRON HCL 4 MG/2ML IJ SOLN
INTRAMUSCULAR | Status: AC
  Filled 2012-10-12: qty 2

## 2012-10-12 MED ORDER — GLYCOPYRROLATE 0.2 MG/ML IJ SOLN
0.2000 mg | Freq: Once | INTRAMUSCULAR | Status: AC
  Administered 2012-10-12: 0.2 mg via INTRAVENOUS

## 2012-10-12 MED ORDER — ONDANSETRON HCL 4 MG/2ML IJ SOLN
4.0000 mg | Freq: Once | INTRAMUSCULAR | Status: AC | PRN
  Administered 2012-10-12: 4 mg via INTRAVENOUS

## 2012-10-12 MED ORDER — ONDANSETRON HCL 4 MG/2ML IJ SOLN
4.0000 mg | Freq: Once | INTRAMUSCULAR | Status: AC
  Administered 2012-10-12: 4 mg via INTRAVENOUS

## 2012-10-12 MED ORDER — GLYCOPYRROLATE 0.2 MG/ML IJ SOLN
INTRAMUSCULAR | Status: DC | PRN
  Administered 2012-10-12: 0.2 mg via INTRAVENOUS
  Administered 2012-10-12: 0.4 mg via INTRAVENOUS
  Administered 2012-10-12: 0.2 mg via INTRAVENOUS

## 2012-10-12 MED ORDER — ROCURONIUM BROMIDE 50 MG/5ML IV SOLN
INTRAVENOUS | Status: AC
  Filled 2012-10-12: qty 1

## 2012-10-12 MED ORDER — PROPOFOL 10 MG/ML IV EMUL
INTRAVENOUS | Status: AC
  Filled 2012-10-12: qty 20

## 2012-10-12 MED ORDER — SUCCINYLCHOLINE CHLORIDE 20 MG/ML IJ SOLN
INTRAMUSCULAR | Status: DC | PRN
  Administered 2012-10-12: 140 mg via INTRAVENOUS

## 2012-10-12 MED ORDER — CEFAZOLIN SODIUM-DEXTROSE 2-3 GM-% IV SOLR
2.0000 g | INTRAVENOUS | Status: AC
  Administered 2012-10-12: 2 g via INTRAVENOUS

## 2012-10-12 MED ORDER — LIDOCAINE HCL (CARDIAC) 10 MG/ML IV SOLN
INTRAVENOUS | Status: DC | PRN
  Administered 2012-10-12: 20 mg via INTRAVENOUS

## 2012-10-12 MED ORDER — BUPIVACAINE HCL (PF) 0.5 % IJ SOLN
INTRAMUSCULAR | Status: AC
  Filled 2012-10-12: qty 30

## 2012-10-12 MED ORDER — FENTANYL CITRATE 0.05 MG/ML IJ SOLN
INTRAMUSCULAR | Status: AC
  Filled 2012-10-12: qty 2

## 2012-10-12 MED ORDER — HYDROCODONE-ACETAMINOPHEN 5-325 MG PO TABS: 2.0000 | ORAL_TABLET | Freq: Once | ORAL | Status: DC

## 2012-10-12 MED ORDER — PROPOFOL 10 MG/ML IV BOLUS
INTRAVENOUS | Status: DC | PRN
  Administered 2012-10-12: 30 mg via INTRAVENOUS
  Administered 2012-10-12: 150 mg via INTRAVENOUS

## 2012-10-12 MED ORDER — MUPIROCIN 2 % EX OINT
TOPICAL_OINTMENT | CUTANEOUS | Status: AC
  Filled 2012-10-12: qty 22

## 2012-10-12 MED ORDER — FENTANYL CITRATE 0.05 MG/ML IJ SOLN
INTRAMUSCULAR | Status: AC
  Filled 2012-10-12: qty 5

## 2012-10-12 MED ORDER — CHLORHEXIDINE GLUCONATE 4 % EX LIQD: 1.0000 "application " | Freq: Once | CUTANEOUS | Status: DC

## 2012-10-12 MED ORDER — SODIUM CHLORIDE 0.9 % IR SOLN
Status: DC | PRN
  Administered 2012-10-12: 1000 mL

## 2012-10-12 MED ORDER — MIDAZOLAM HCL 2 MG/2ML IJ SOLN
INTRAMUSCULAR | Status: AC
  Filled 2012-10-12: qty 2

## 2012-10-12 MED ORDER — KETOROLAC TROMETHAMINE 30 MG/ML IJ SOLN
INTRAMUSCULAR | Status: AC
  Filled 2012-10-12: qty 1

## 2012-10-12 MED ORDER — HYDROCODONE-ACETAMINOPHEN 5-325 MG PO TABS
ORAL_TABLET | ORAL | Status: AC
  Filled 2012-10-12: qty 2

## 2012-10-12 MED ORDER — HEMOSTATIC AGENTS (NO CHARGE) OPTIME
TOPICAL | Status: DC | PRN
  Administered 2012-10-12: 1 via TOPICAL

## 2012-10-12 MED ORDER — CEFAZOLIN SODIUM-DEXTROSE 2-3 GM-% IV SOLR
INTRAVENOUS | Status: AC
  Filled 2012-10-12: qty 50

## 2012-10-12 MED ORDER — HYDROCODONE-ACETAMINOPHEN 10-325 MG PO TABS: 1.0000 | ORAL_TABLET | Freq: Four times a day (QID) | ORAL | Status: DC | PRN

## 2012-10-12 MED ORDER — GLYCOPYRROLATE 0.2 MG/ML IJ SOLN
INTRAMUSCULAR | Status: AC
  Filled 2012-10-12: qty 1

## 2012-10-12 MED ORDER — LIDOCAINE HCL (PF) 1 % IJ SOLN
INTRAMUSCULAR | Status: AC
  Filled 2012-10-12: qty 5

## 2012-10-12 MED ORDER — NEOSTIGMINE METHYLSULFATE 1 MG/ML IJ SOLN
INTRAMUSCULAR | Status: DC | PRN
  Administered 2012-10-12: 2 mg via INTRAVENOUS
  Administered 2012-10-12: 1 mg via INTRAVENOUS

## 2012-10-12 MED ORDER — ENOXAPARIN SODIUM 40 MG/0.4ML ~~LOC~~ SOLN
SUBCUTANEOUS | Status: AC
  Filled 2012-10-12: qty 0.4

## 2012-10-12 MED ORDER — ENOXAPARIN SODIUM 40 MG/0.4ML ~~LOC~~ SOLN
40.0000 mg | Freq: Once | SUBCUTANEOUS | Status: AC
  Administered 2012-10-12: 40 mg via SUBCUTANEOUS

## 2012-10-12 MED ORDER — FENTANYL CITRATE 0.05 MG/ML IJ SOLN
25.0000 ug | INTRAMUSCULAR | Status: DC | PRN
  Administered 2012-10-12 (×4): 50 ug via INTRAVENOUS

## 2012-10-12 MED ORDER — NEOSTIGMINE METHYLSULFATE 1 MG/ML IJ SOLN
INTRAMUSCULAR | Status: AC
  Filled 2012-10-12: qty 10

## 2012-10-12 MED ORDER — FENTANYL CITRATE 0.05 MG/ML IJ SOLN
INTRAMUSCULAR | Status: DC | PRN
  Administered 2012-10-12 (×5): 50 ug via INTRAVENOUS

## 2012-10-12 MED ORDER — MIDAZOLAM HCL 2 MG/2ML IJ SOLN
1.0000 mg | INTRAMUSCULAR | Status: DC | PRN
  Administered 2012-10-12 (×2): 2 mg via INTRAVENOUS

## 2012-10-12 MED ORDER — KETOROLAC TROMETHAMINE 30 MG/ML IJ SOLN
30.0000 mg | Freq: Once | INTRAMUSCULAR | Status: AC
  Administered 2012-10-12: 30 mg via INTRAVENOUS

## 2012-10-12 MED ORDER — LACTATED RINGERS IV SOLN
INTRAVENOUS | Status: DC
  Administered 2012-10-12: 1000 mL via INTRAVENOUS

## 2012-10-12 MED ORDER — SUCCINYLCHOLINE CHLORIDE 20 MG/ML IJ SOLN
INTRAMUSCULAR | Status: AC
  Filled 2012-10-12: qty 1

## 2012-10-12 MED ORDER — BUPIVACAINE HCL 0.5 % IJ SOLN
INTRAMUSCULAR | Status: DC | PRN
  Administered 2012-10-12: 10 mL

## 2012-10-12 MED ORDER — GLYCOPYRROLATE 0.2 MG/ML IJ SOLN
INTRAMUSCULAR | Status: AC
  Filled 2012-10-12: qty 3

## 2012-10-12 MED ORDER — MUPIROCIN 2 % EX OINT
TOPICAL_OINTMENT | Freq: Two times a day (BID) | CUTANEOUS | Status: DC
  Administered 2012-10-12: 1 via NASAL

## 2012-10-12 MED ORDER — ROCURONIUM BROMIDE 100 MG/10ML IV SOLN
INTRAVENOUS | Status: DC | PRN
  Administered 2012-10-12: 25 mg via INTRAVENOUS
  Administered 2012-10-12: 5 mg via INTRAVENOUS

## 2012-10-12 SURGICAL SUPPLY — 37 items
APPLIER CLIP ROT 10 11.4 M/L (STAPLE) ×2
APR CLP MED LRG 11.4X10 (STAPLE) ×1
BAG HAMPER (MISCELLANEOUS) ×2 IMPLANT
BAG SPEC RTRVL LRG 6X4 10 (ENDOMECHANICALS) ×1
CLIP APPLIE ROT 10 11.4 M/L (STAPLE) ×1 IMPLANT
CLOTH BEACON ORANGE TIMEOUT ST (SAFETY) ×2 IMPLANT
COVER LIGHT HANDLE STERIS (MISCELLANEOUS) ×4 IMPLANT
DECANTER SPIKE VIAL GLASS SM (MISCELLANEOUS) ×2 IMPLANT
DURAPREP 26ML APPLICATOR (WOUND CARE) ×2 IMPLANT
ELECT REM PT RETURN 9FT ADLT (ELECTROSURGICAL) ×2
ELECTRODE REM PT RTRN 9FT ADLT (ELECTROSURGICAL) ×1 IMPLANT
FILTER SMOKE EVAC LAPAROSHD (FILTER) ×2 IMPLANT
FORMALIN 10 PREFIL 120ML (MISCELLANEOUS) ×2 IMPLANT
GLOVE BIO SURGEON STRL SZ7.5 (GLOVE) ×2 IMPLANT
GLOVE BIOGEL PI IND STRL 7.0 (GLOVE) ×1 IMPLANT
GLOVE BIOGEL PI IND STRL 7.5 (GLOVE) ×1 IMPLANT
GLOVE BIOGEL PI INDICATOR 7.0 (GLOVE) ×1
GLOVE BIOGEL PI INDICATOR 7.5 (GLOVE) ×1
GLOVE ECLIPSE 6.5 STRL STRAW (GLOVE) ×2 IMPLANT
GLOVE ECLIPSE 7.0 STRL STRAW (GLOVE) ×2 IMPLANT
GOWN STRL REIN XL XLG (GOWN DISPOSABLE) ×6 IMPLANT
HEMOSTAT SNOW SURGICEL 2X4 (HEMOSTASIS) ×2 IMPLANT
INST SET LAPROSCOPIC AP (KITS) ×2 IMPLANT
KIT ROOM TURNOVER APOR (KITS) ×2 IMPLANT
KIT TROCAR LAP CHOLE (TROCAR) ×2 IMPLANT
MANIFOLD NEPTUNE II (INSTRUMENTS) ×2 IMPLANT
NS IRRIG 1000ML POUR BTL (IV SOLUTION) ×2 IMPLANT
PACK LAP CHOLE LZT030E (CUSTOM PROCEDURE TRAY) ×2 IMPLANT
PAD ARMBOARD 7.5X6 YLW CONV (MISCELLANEOUS) ×2 IMPLANT
POUCH SPECIMEN RETRIEVAL 10MM (ENDOMECHANICALS) ×2 IMPLANT
SET BASIN LINEN APH (SET/KITS/TRAYS/PACK) ×2 IMPLANT
SPONGE GAUZE 2X2 8PLY STRL LF (GAUZE/BANDAGES/DRESSINGS) ×8 IMPLANT
STAPLER VISISTAT (STAPLE) ×2 IMPLANT
SUT VICRYL 0 UR6 27IN ABS (SUTURE) ×2 IMPLANT
TAPE CLOTH SURG 4X10 WHT LF (GAUZE/BANDAGES/DRESSINGS) ×2 IMPLANT
WARMER LAPAROSCOPE (MISCELLANEOUS) ×2 IMPLANT
YANKAUER SUCT 12FT TUBE ARGYLE (SUCTIONS) ×2 IMPLANT

## 2012-10-12 NOTE — Transfer of Care (Signed)
Immediate Anesthesia Transfer of Care Note  Patient: Tony Meadows  Procedure(s) Performed: Procedure(s) (LRB): LAPAROSCOPIC CHOLECYSTECTOMY (N/A)  Patient Location: PACU  Anesthesia Type: General  Level of Consciousness: awake  Airway & Oxygen Therapy: Patient Spontanous Breathing and non-rebreather face mask  Post-op Assessment: Report given to PACU RN, Post -op Vital signs reviewed and stable and Patient moving all extremities  Post vital signs: Reviewed and stable  Complications: No apparent anesthesia complications

## 2012-10-12 NOTE — Anesthesia Postprocedure Evaluation (Signed)
Anesthesia Post Note  Patient: Tony Meadows  Procedure(s) Performed: Procedure(s) (LRB): LAPAROSCOPIC CHOLECYSTECTOMY (N/A)  Anesthesia type: General  Patient location: PACU  Post pain: Pain level controlled  Post assessment: Post-op Vital signs reviewed, Patient's Cardiovascular Status Stable, Respiratory Function Stable, Patent Airway, No signs of Nausea or vomiting and Pain level controlled  Last Vitals:  Filed Vitals:   10/12/12 1235  BP: 132/86  Pulse: 85  Temp: 36.5 C  Resp: 12    Post vital signs: Reviewed and stable  Level of consciousness: awake and alert   Complications: No apparent anesthesia complications

## 2012-10-12 NOTE — Interval H&P Note (Signed)
History and Physical Interval Note:  10/12/2012 10:58 AM  Tony Meadows  has presented today for surgery, with the diagnosis of Cholelithiasis  The various methods of treatment have been discussed with the patient and family. After consideration of risks, benefits and other options for treatment, the patient has consented to  Procedure(s) (LRB) with comments: LAPAROSCOPIC CHOLECYSTECTOMY (N/A) as a surgical intervention .  The patient's history has been reviewed, patient examined, no change in status, stable for surgery.  I have reviewed the patient's chart and labs.  Questions were answered to the patient's satisfaction.     Franky Macho A

## 2012-10-12 NOTE — Anesthesia Procedure Notes (Signed)
Procedure Name: Intubation Date/Time: 10/12/2012 11:50 AM Performed by: Franco Nones Pre-anesthesia Checklist: Patient identified, Patient being monitored, Timeout performed, Emergency Drugs available and Suction available Patient Re-evaluated:Patient Re-evaluated prior to inductionOxygen Delivery Method: Circle System Utilized Preoxygenation: Pre-oxygenation with 100% oxygen Intubation Type: IV induction, Rapid sequence and Cricoid Pressure applied Laryngoscope Size: Miller and 2 Grade View: Grade I Tube type: Oral Tube size: 7.0 mm Number of attempts: 1 Airway Equipment and Method: stylet Placement Confirmation: ETT inserted through vocal cords under direct vision,  positive ETCO2 and breath sounds checked- equal and bilateral Secured at: 21 cm Tube secured with: Tape Dental Injury: Teeth and Oropharynx as per pre-operative assessment

## 2012-10-12 NOTE — Anesthesia Preprocedure Evaluation (Signed)
Anesthesia Evaluation  Patient identified by MRN, date of birth, ID band Patient awake    Reviewed: Allergy & Precautions, H&P , NPO status , Patient's Chart, lab work & pertinent test results  Airway Mallampati: I TM Distance: >3 FB     Dental  (+) Edentulous Upper and Edentulous Lower   Pulmonary Current Smoker,  breath sounds clear to auscultation        Cardiovascular hypertension, Pt. on medications Rhythm:Regular Rate:Normal     Neuro/Psych    GI/Hepatic GERD-  Medicated and Controlled,  Endo/Other    Renal/GU      Musculoskeletal   Abdominal   Peds  Hematology   Anesthesia Other Findings   Reproductive/Obstetrics                           Anesthesia Physical Anesthesia Plan  ASA: II  Anesthesia Plan: General   Post-op Pain Management:    Induction: Intravenous, Rapid sequence and Cricoid pressure planned  Airway Management Planned: Oral ETT  Additional Equipment:   Intra-op Plan:   Post-operative Plan: Extubation in OR  Informed Consent: I have reviewed the patients History and Physical, chart, labs and discussed the procedure including the risks, benefits and alternatives for the proposed anesthesia with the patient or authorized representative who has indicated his/her understanding and acceptance.     Plan Discussed with:   Anesthesia Plan Comments:         Anesthesia Quick Evaluation

## 2012-10-12 NOTE — Op Note (Signed)
Patient:  Tony Meadows  DOB:  07/13/1966  MRN:  [id number]   Preop Diagnosis:  Biliary colic, cholelithiasis  Postop Diagnosis:  Same  Procedure: Laparoscopic Cholecystectomy  Surgeon:  Franky Macho, M.D.  Anes:  General endotracheal  Indications:  Patient is a 46 year old white male who presents with biliary colic secondary to biliary sludge. The risks and benefits of the procedure including bleeding, infection, hepatobiliary injury, the possibly of an open procedure were fully explained to the patient, gave informed consent.  Procedure note:  The patient was placed the supine position. After induction of general endotracheal anesthesia, the abdomen was prepped and draped using usual sterile technique with DuraPrep. Surgical site confirmation was performed.  A supraumbilical incision was made down to the fascia. A Veress needle was introduced into the abdominal cavity and confirmation of placement was done using the saline drop test. The abdomen was then insufflated to 16 mm mercury pressure. An 11 mm trocar was introduced into the abdominal cavity under direct visualization without difficulty. The patient was placed in reverse Trendelenburg position and additional 11 mm trocar was placed the epigastric region and 5 mm trochars were placed the right upper quadrant right flank regions. The liver was inspected and noted within normal limits. There were  multiple adhesions of omentum around the gallbladder. These were freed away both bluntly and sharply without difficulty. The gallbladder was retracted superiorly and laterally. Dissection was begun around the infundibulum of the gallbladder. The cystic duct was first identified. Its juncture to the infundibulum was fully identified. Endoclips were placed proximally and distally on the cystic duct and the cystic duct was divided. This was likewise done to the cystic artery. The gallbladder was then freed away from the gallbladder fossa using Bovie  electrocautery. The gallbladder was delivered through the epigastric trocar site using an Endo Catch bag. The gallbladder fossa was inspected and no abnormal bleeding or bile leakage was noted. Surgicel was placed in the gallbladder fossa. All fluid and air were then evacuated from the abdominal cavity prior to the removal of the trochars.  All wounds were gave normal saline. All wounds were injected with 0.5% Sensorcaine. The supraumbilical fascia as well as epigastric fascia reapproximated using 0 Vicryl interrupted sutures. All skin incisions were closed using staples. Betadine ointment and dressed a dressings were applied.  All tape and needle counts were correct at the end of the procedure. Patient was extubated in the operating room and went back to PACU in stable condition.  Complications:  None  EBL:  Minimal  Specimen:  Gallbladder

## 2012-10-17 ENCOUNTER — Encounter (HOSPITAL_COMMUNITY): Payer: Self-pay | Admitting: General Surgery

## 2012-10-19 ENCOUNTER — Encounter (HOSPITAL_COMMUNITY): Payer: Self-pay

## 2012-10-19 ENCOUNTER — Emergency Department (HOSPITAL_COMMUNITY)
Admission: EM | Admit: 2012-10-19 | Discharge: 2012-10-19 | Disposition: A | Discharge status: Home / Self Care | Payer: PRIVATE HEALTH INSURANCE | Attending: Emergency Medicine | Admitting: Emergency Medicine

## 2012-10-19 ENCOUNTER — Emergency Department (HOSPITAL_COMMUNITY): Payer: PRIVATE HEALTH INSURANCE

## 2012-10-19 DIAGNOSIS — K219 Gastro-esophageal reflux disease without esophagitis: Secondary | ICD-10-CM | POA: Insufficient documentation

## 2012-10-19 DIAGNOSIS — F17200 Nicotine dependence, unspecified, uncomplicated: Secondary | ICD-10-CM | POA: Insufficient documentation

## 2012-10-19 DIAGNOSIS — M069 Rheumatoid arthritis, unspecified: Secondary | ICD-10-CM | POA: Insufficient documentation

## 2012-10-19 DIAGNOSIS — Z79899 Other long term (current) drug therapy: Secondary | ICD-10-CM | POA: Insufficient documentation

## 2012-10-19 DIAGNOSIS — R232 Flushing: Secondary | ICD-10-CM

## 2012-10-19 DIAGNOSIS — R209 Unspecified disturbances of skin sensation: Secondary | ICD-10-CM | POA: Insufficient documentation

## 2012-10-19 DIAGNOSIS — G4730 Sleep apnea, unspecified: Secondary | ICD-10-CM | POA: Insufficient documentation

## 2012-10-19 DIAGNOSIS — Z7982 Long term (current) use of aspirin: Secondary | ICD-10-CM | POA: Insufficient documentation

## 2012-10-19 DIAGNOSIS — Z8709 Personal history of other diseases of the respiratory system: Secondary | ICD-10-CM | POA: Insufficient documentation

## 2012-10-19 DIAGNOSIS — Z884 Allergy status to anesthetic agent status: Secondary | ICD-10-CM | POA: Insufficient documentation

## 2012-10-19 DIAGNOSIS — I10 Essential (primary) hypertension: Secondary | ICD-10-CM | POA: Insufficient documentation

## 2012-10-19 LAB — CBC WITH DIFFERENTIAL/PLATELET
Basophils Absolute: 0 10*3/uL (ref 0.0–0.1)
Basophils Relative: 0 % (ref 0–1)
Eosinophils Absolute: 0.1 10*3/uL (ref 0.0–0.7)
Lymphs Abs: 2.7 10*3/uL (ref 0.7–4.0)
MCH: 31.1 pg (ref 26.0–34.0)
MCHC: 35.5 g/dL (ref 30.0–36.0)
Neutrophils Relative %: 62 % (ref 43–77)
Platelets: 245 10*3/uL (ref 150–400)
RBC: 4.53 MIL/uL (ref 4.22–5.81)
RDW: 12.5 % (ref 11.5–15.5)

## 2012-10-19 LAB — COMPREHENSIVE METABOLIC PANEL
ALT: 18 U/L (ref 0–53)
AST: 14 U/L (ref 0–37)
Albumin: 3.7 g/dL (ref 3.5–5.2)
Alkaline Phosphatase: 110 U/L (ref 39–117)
Calcium: 9.4 mg/dL (ref 8.4–10.5)
Potassium: 4 mEq/L (ref 3.5–5.1)
Sodium: 145 mEq/L (ref 135–145)
Total Protein: 7.6 g/dL (ref 6.0–8.3)

## 2012-10-19 NOTE — ED Provider Notes (Signed)
Medical screening examination/treatment/procedure(s) were performed by non-physician practitioner and as supervising physician I was immediately available for consultation/collaboration.  Henretta Quist T Telisa Ohlsen, MD 10/19/12 2059 

## 2012-10-19 NOTE — ED Provider Notes (Signed)
History     CSN: [id number]  Arrival date & time 10/19/12  1203   First MD Initiated Contact with Patient 10/19/12 1418      Chief Complaint  Patient presents with  . Chest Pain    (Consider location/radiation/quality/duration/timing/severity/associated sxs/prior treatment) HPI  46 year old male with history of GERD, history of hypertension presents complaining of flushing sensation.  Patient reports 3 months ago he was placed on Norvasc for hypertension through his primary care Dr. Since taking the medication he has been experiencing a flushing sensation throughout body on a daily basis. Patient was recently undergoing cholecystectomy a week ago. For the past week he has not been taking his Norvasc and state he felt much better without any flushing symptoms. He resumed his medication 2 days ago. Today flushing sensation returns but the intense. He felt burning hot sensation throughout body. Symptom seems to be worsened than usual which concerns him. Onset was gradual, persistent, resolving, initially moderate in severity but now mild. Aside from mild pain at the laparoscopic surgical site on his abdomen he denies any other symptoms patient denies fever, chills, chest pain, shortness of breath, throat swelling, itching, weakness or numbness. He is scheduled to see his primary care Dr. Bea Laura for followup.  Patient does endorse smoking history.  he denies any family history of premature cardiac death and no history of diabetes.  Past Medical History  Diagnosis Date  . Bulging disc   . Arthritis     Rheumatoid  . Hypertension   . Shortness of breath   . GERD (gastroesophageal reflux disease)   . Complication of anesthesia     pt states that he had a lot of back pain after spinal with hernia repair  . Sleep apnea     STOP BANG score = 4    Past Surgical History  Procedure Date  . Tonsillectomy   . Left foot repair     due to chain saw accident  . Hernia repair   .  Esophagogastroduodenoscopy 10/04/2012    Procedure: ESOPHAGOGASTRODUODENOSCOPY (EGD);  Surgeon: Dalia Heading, MD;  Location: [facility];  Service: Gastroenterology;  Laterality: N/A;  . Cholecystectomy 10/12/2012    Procedure: LAPAROSCOPIC CHOLECYSTECTOMY;  Surgeon: Dalia Heading, MD;  Location: [facility];  Service: General;  Laterality: N/A;    No family history on file.  History  Substance Use Topics  . Smoking status: Current Every Day Smoker -- 2.0 packs/day for 35 years    Types: Cigarettes  . Smokeless tobacco: Not on file  . Alcohol Use: No      Review of Systems  All other systems reviewed and are negative.    Allergies  Review of patient's allergies indicates no known allergies.  Home Medications   Current Outpatient Rx  Name Route Sig Dispense Refill  . AMLODIPINE BESYLATE 5 MG PO TABS Oral Take 5 mg by mouth daily.    . ASPIRIN 325 MG PO TABS Oral Take 325 mg by mouth daily.    Marland Kitchen ESOMEPRAZOLE MAGNESIUM 40 MG PO CPDR Oral Take 40 mg by mouth daily before breakfast.    . HYDROCODONE-ACETAMINOPHEN 10-325 MG PO TABS Oral Take 1 tablet by mouth every 6 (six) hours as needed. For pain 30 tablet 0  . FISH OIL BURP-LESS PO Oral Take 1,400 mg by mouth 3 (three) times daily.    Marland Kitchen ZOLPIDEM TARTRATE 5 MG PO TABS Oral Take 5 mg by mouth at bedtime as needed.  BP 149/76  Pulse 100  Temp 98.1 F (36.7 C) (Oral)  Resp 20  SpO2 98%  Physical Exam  Nursing note and vitals reviewed. Constitutional: He appears well-developed and well-nourished. No distress.       Awake, alert, nontoxic appearance  HENT:  Head: Atraumatic.  Mouth/Throat: Oropharynx is clear and moist.  Eyes: Conjunctivae normal are normal. Right eye exhibits no discharge. Left eye exhibits no discharge.  Neck: Normal range of motion. Neck supple.  Cardiovascular: Normal rate and regular rhythm.   Pulmonary/Chest: Effort normal. No respiratory distress. He has no wheezes. He has no rales. He  exhibits no tenderness.  Abdominal: Soft. There is no tenderness. There is no rebound.       Several well-healing abdominal surgery scars with staples in place.    Musculoskeletal: He exhibits no edema and no tenderness.       ROM appears intact, no obvious focal weakness  Neurological: He is alert.  Skin: Skin is warm and dry. No rash noted.  Psychiatric: He has a normal mood and affect.    ED Course  Procedures (including critical care time)  Labs Reviewed  COMPREHENSIVE METABOLIC PANEL - Abnormal; Notable for the following:    Total Bilirubin 0.2 (*)     All other components within normal limits  TROPONIN I  CBC WITH DIFFERENTIAL   Dg Chest 2 View  10/19/2012  *RADIOLOGY REPORT*  Clinical Data: Chest pain with left arm weakness.  Hypertension. Recent cholecystectomy.  CHEST - 2 VIEW  Comparison: 08/25/2012  Findings: Lateral view degraded by patient arm position.  Midline trachea.  Normal heart size and mediastinal contours. Cholecystectomy clips. No free intraperitoneal air.  No pleural effusion or pneumothorax.  Diffuse peribronchial thickening.  Clear lungs.  IMPRESSION: 1.  No acute cardiopulmonary disease. 2.  Mild peribronchial thickening which may relate to chronic bronchitis or smoking.   Original Report Authenticated By: Consuello Bossier, M.D.      No diagnosis found.   Date: 10/19/2012  Rate:98  Rhythm: normal sinus rhythm  QRS Axis: normal  Intervals: normal  ST/T Wave abnormalities: normal  Conduction Disutrbances: none  Narrative Interpretation:   Old EKG Reviewed: No significant changes noted  Results for orders placed during the hospital encounter of 10/19/12  TROPONIN I      Component Value Range   Troponin I <0.30  <0.30 ng/mL  CBC WITH DIFFERENTIAL      Component Value Range   WBC 9.2  4.0 - 10.5 K/uL   RBC 4.53  4.22 - 5.81 MIL/uL   Hemoglobin 14.1  13.0 - 17.0 g/dL   HCT 62.9  52.8 - 41.3 %   MCV 87.6  78.0 - 100.0 fL   MCH 31.1  26.0 - 34.0 pg    MCHC 35.5  30.0 - 36.0 g/dL   RDW 24.4  01.0 - 27.2 %   Platelets 245  150 - 400 K/uL   Neutrophils Relative 62  43 - 77 %   Neutro Abs 5.7  1.7 - 7.7 K/uL   Lymphocytes Relative 29  12 - 46 %   Lymphs Abs 2.7  0.7 - 4.0 K/uL   Monocytes Relative 8  3 - 12 %   Monocytes Absolute 0.7  0.1 - 1.0 K/uL   Eosinophils Relative 1  0 - 5 %   Eosinophils Absolute 0.1  0.0 - 0.7 K/uL   Basophils Relative 0  0 - 1 %   Basophils Absolute 0.0  0.0 -  0.1 K/uL  COMPREHENSIVE METABOLIC PANEL      Component Value Range   Sodium 145  135 - 145 mEq/L   Potassium 4.0  3.5 - 5.1 mEq/L   Chloride 105  96 - 112 mEq/L   CO2 29  19 - 32 mEq/L   Glucose, Bld 91  70 - 99 mg/dL   BUN 11  6 - 23 mg/dL   Creatinine, Ser 0.45  0.50 - 1.35 mg/dL   Calcium 9.4  8.4 - 40.9 mg/dL   Total Protein 7.6  6.0 - 8.3 g/dL   Albumin 3.7  3.5 - 5.2 g/dL   AST 14  0 - 37 U/L   ALT 18  0 - 53 U/L   Alkaline Phosphatase 110  39 - 117 U/L   Total Bilirubin 0.2 (*) 0.3 - 1.2 mg/dL   GFR calc non Af Amer >90  >90 mL/min   GFR calc Af Amer >90  >90 mL/min  TROPONIN I      Component Value Range   Troponin I <0.30  <0.30 ng/mL   Dg Chest 2 View  10/19/2012  *RADIOLOGY REPORT*  Clinical Data: Chest pain with left arm weakness.  Hypertension. Recent cholecystectomy.  CHEST - 2 VIEW  Comparison: 08/25/2012  Findings: Lateral view degraded by patient arm position.  Midline trachea.  Normal heart size and mediastinal contours. Cholecystectomy clips. No free intraperitoneal air.  No pleural effusion or pneumothorax.  Diffuse peribronchial thickening.  Clear lungs.  IMPRESSION: 1.  No acute cardiopulmonary disease. 2.  Mild peribronchial thickening which may relate to chronic bronchitis or smoking.   Original Report Authenticated By: Consuello Bossier, M.D.       1. Flushing 2/2 norvasc  MDM  Contrary to nursing note, patient's primary complaint was a flushing sensation after taking Norvasc. Patient currently denies any chest pain or  shortness of breath. The patient also reports that the flushing sensation has subsided and he is back to his normal baseline. He is afebrile with stable normal vital signs.he has normal electrolytes, normal white count, and unremarkable chest x-ray, and a normal EKG. I will check Delta Troponin prior to discharge.  Pt agrees to f/u with PCP to discussed medication options to prevent flushing.  Through [employer]scape, flushing account for up to 3% of pt's potential side effect.    4:15 PM Patient has negative delta troponin. Currently symptom free. Patient stable to be discharged.   BP 125/72  Pulse 81  Temp 98.1 F (36.7 C) (Oral)  Resp 20  SpO2 97%  I have reviewed nursing notes and vital signs. I personally reviewed the imaging tests through PACS system  I reviewed available ER/hospitalization records thought the EMR   Fayrene Helper, [place] 10/19/12 1639

## 2012-10-19 NOTE — ED Notes (Signed)
Pt is being treated for back problems and today started having chest pain and arm pain, burning sensation, seen for same at [facility]

## 2012-12-24 IMAGING — CR DG CHEST 2V
2 series · 2 of 2 positions shown · non-contrast
Comparison: 08/25/2012

CLINICAL DATA: Chest pain with left arm weakness.  Hypertension.
Recent cholecystectomy.

CHEST - 2 VIEW

[w chest pa]
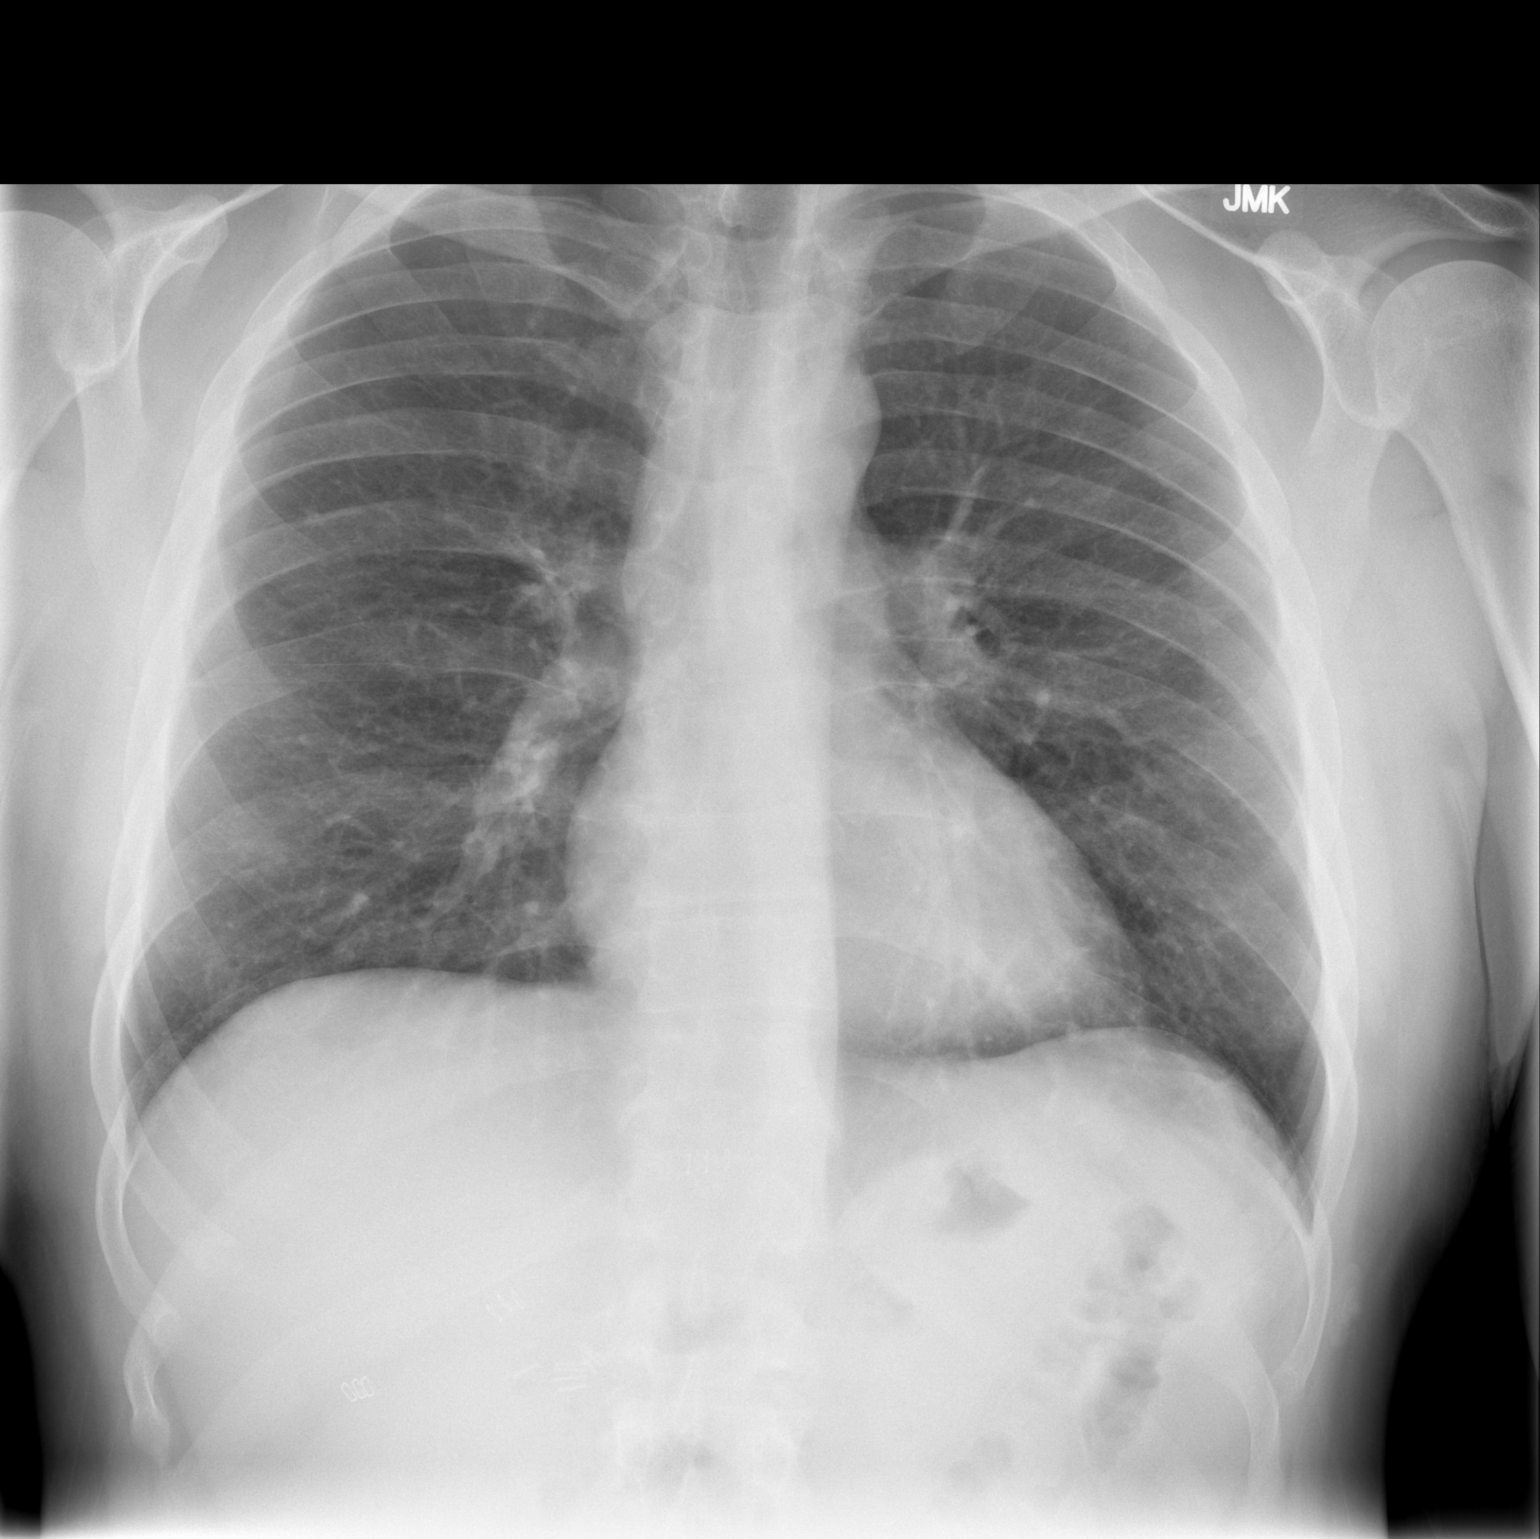

[w chest lat]
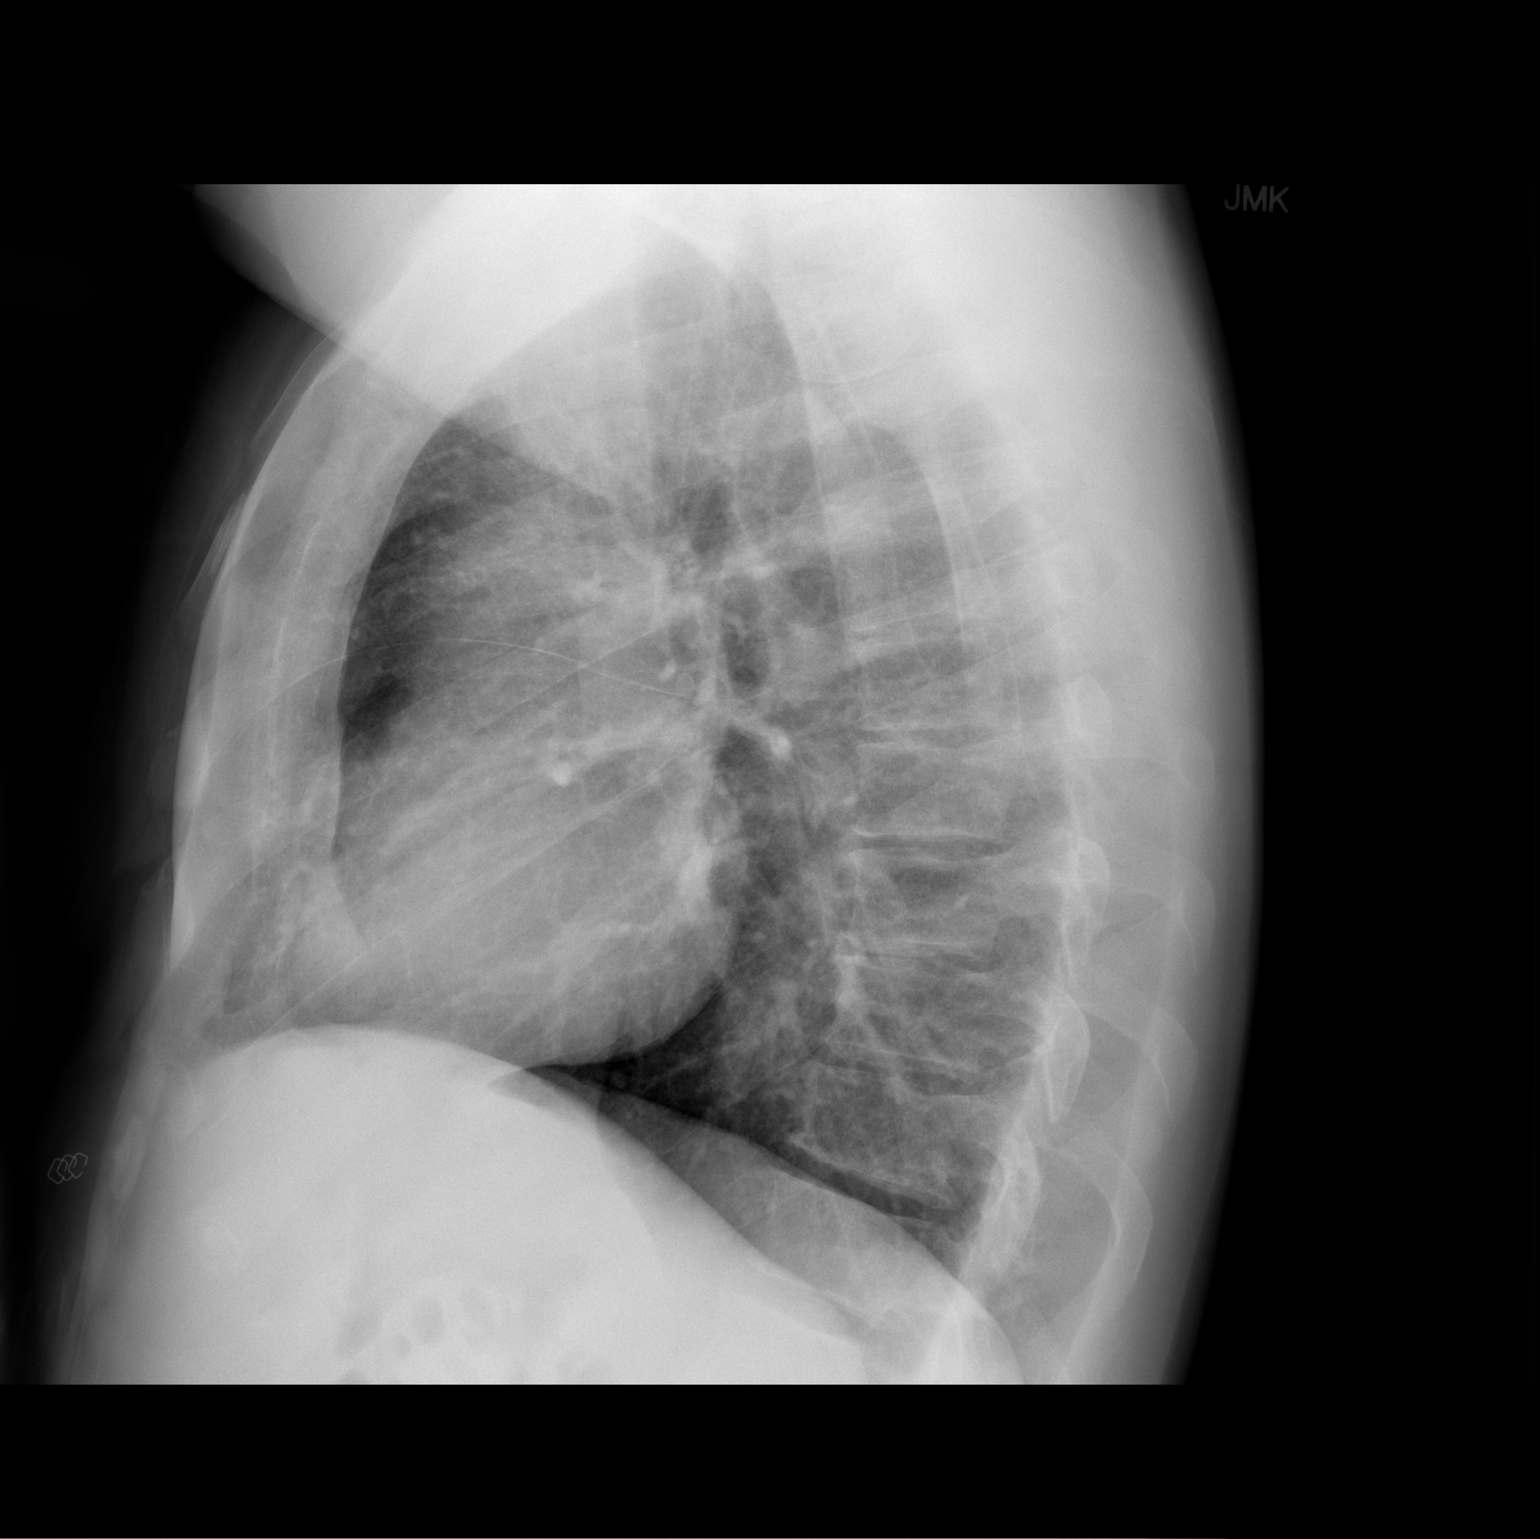

[2 of 2 positions shown; findings below may reference images not displayed]

FINDINGS: Lateral view degraded by patient arm position.

Midline trachea.  Normal heart size and mediastinal contours.
Cholecystectomy clips. No free intraperitoneal air.  No pleural
effusion or pneumothorax.  Diffuse peribronchial thickening.  Clear
lungs.
IMPRESSION: 1.  No acute cardiopulmonary disease.
2.  Mild peribronchial thickening which may relate to chronic
bronchitis or smoking.

## 2013-02-16 ENCOUNTER — Ambulatory Visit
Admission: RE | Admit: 2013-02-16 | Discharge: 2013-02-16 | Disposition: A | Discharge status: Home / Self Care | Payer: PRIVATE HEALTH INSURANCE | Source: Ambulatory Visit | Attending: Rheumatology | Admitting: Rheumatology

## 2013-02-16 ENCOUNTER — Other Ambulatory Visit: Payer: Self-pay | Admitting: Rheumatology

## 2013-02-16 DIAGNOSIS — R109 Unspecified abdominal pain: Secondary | ICD-10-CM

## 2013-02-16 DIAGNOSIS — K5900 Constipation, unspecified: Secondary | ICD-10-CM

## 2013-03-09 ENCOUNTER — Encounter (HOSPITAL_COMMUNITY): Payer: PRIVATE HEALTH INSURANCE

## 2013-03-09 ENCOUNTER — Encounter (HOSPITAL_COMMUNITY): Payer: PRIVATE HEALTH INSURANCE | Attending: "Endocrinology

## 2013-03-09 DIAGNOSIS — E279 Disorder of adrenal gland, unspecified: Secondary | ICD-10-CM | POA: Insufficient documentation

## 2013-03-09 DIAGNOSIS — E278 Other specified disorders of adrenal gland: Secondary | ICD-10-CM

## 2013-03-09 MED ORDER — COSYNTROPIN 0.25 MG IJ SOLR
0.2500 mg | Freq: Once | INTRAMUSCULAR | Status: AC
  Administered 2013-03-09: 0.25 mg via INTRAVENOUS
  Filled 2013-03-09: qty 0.25

## 2013-03-09 NOTE — Progress Notes (Signed)
Cortrosyn 0.25 mg administered IV push via #23 g angiocath left antecubital. Pt tolerated well

## 2013-03-09 NOTE — Progress Notes (Signed)
Labs drawn today for acth test

## 2013-03-11 LAB — ACTH STIMULATION, 3 TIME POINTS
Cortisol, 30 Min: 18.2 ug/dL — ABNORMAL LOW (ref >20)
Cortisol, 60 Min: 21 ug/dL (ref >20)

## 2013-08-01 ENCOUNTER — Ambulatory Visit (HOSPITAL_COMMUNITY): Payer: PRIVATE HEALTH INSURANCE

## 2013-08-01 ENCOUNTER — Other Ambulatory Visit (HOSPITAL_COMMUNITY): Payer: PRIVATE HEALTH INSURANCE

## 2013-08-01 ENCOUNTER — Encounter (HOSPITAL_COMMUNITY): Payer: PRIVATE HEALTH INSURANCE

## 2013-08-01 ENCOUNTER — Encounter (HOSPITAL_COMMUNITY): Admission: RE | Admit: 2013-08-01 | Payer: PRIVATE HEALTH INSURANCE | Source: Ambulatory Visit

## 2013-08-03 ENCOUNTER — Encounter (HOSPITAL_COMMUNITY)
Admission: RE | Admit: 2013-08-03 | Discharge: 2013-08-03 | Disposition: A | Discharge status: Home / Self Care | Payer: PRIVATE HEALTH INSURANCE | Source: Ambulatory Visit | Attending: "Endocrinology | Admitting: "Endocrinology

## 2013-08-03 ENCOUNTER — Encounter (HOSPITAL_COMMUNITY): Payer: Self-pay

## 2013-08-03 DIAGNOSIS — E279 Disorder of adrenal gland, unspecified: Secondary | ICD-10-CM | POA: Insufficient documentation

## 2013-08-03 MED ORDER — SODIUM CHLORIDE 0.9 % IJ SOLN
10.0000 mL | INTRAMUSCULAR | Status: AC | PRN
  Administered 2013-08-03 (×4): 10 mL via INTRAVENOUS

## 2013-08-03 MED ORDER — COSYNTROPIN 0.25 MG IJ SOLR
0.2500 mg | Freq: Once | INTRAMUSCULAR | Status: AC
  Administered 2013-08-03: 0.25 mg via INTRAVENOUS
  Filled 2013-08-03: qty 0.25

## 2013-08-03 NOTE — Progress Notes (Signed)
Cortrosyn 0.25mg  IV injection given.  Labs drawn per ACTH protocol.  Pt. Tolerated test well.

## 2013-08-04 LAB — ACTH STIMULATION, 3 TIME POINTS
Cortisol, 60 Min: 19 ug/dL — ABNORMAL LOW (ref >20)
Cortisol, Base: 14.4 ug/dL

## 2013-08-04 NOTE — Progress Notes (Signed)
Results for DENIM, KALMBACH (MRN [id number]) as of 08/04/2013 14:04  Ref. Range 08/03/2013 08:00  Cortisol, Base No range found 14.4  Cortisol, 30 Min Latest Range: >20 ug/dL 96.2 (L)  Cortisol, 60 Min Latest Range: >20 ug/dL 95.2 (L)

## 2013-10-09 ENCOUNTER — Telehealth: Payer: Self-pay | Admitting: Gastroenterology

## 2013-10-09 ENCOUNTER — Encounter: Payer: Self-pay | Admitting: Gastroenterology

## 2013-10-09 ENCOUNTER — Ambulatory Visit: Payer: PRIVATE HEALTH INSURANCE | Admitting: Gastroenterology

## 2013-10-09 NOTE — Telephone Encounter (Signed)
PCP is aware and I mailed letter to patient ° °

## 2013-10-09 NOTE — Telephone Encounter (Signed)
Pt was a no show

## 2013-11-01 ENCOUNTER — Encounter (HOSPITAL_COMMUNITY): Payer: Self-pay

## 2013-11-01 ENCOUNTER — Encounter (HOSPITAL_COMMUNITY)
Admission: RE | Admit: 2013-11-01 | Discharge: 2013-11-01 | Disposition: A | Discharge status: Home / Self Care | Payer: PRIVATE HEALTH INSURANCE | Source: Ambulatory Visit | Attending: "Endocrinology | Admitting: "Endocrinology

## 2013-11-01 DIAGNOSIS — E2749 Other adrenocortical insufficiency: Secondary | ICD-10-CM | POA: Insufficient documentation

## 2013-11-01 LAB — HEMOGLOBIN A1C
Hgb A1c MFr Bld: 5.2 % (ref <5.7)
Mean Plasma Glucose: 103 mg/dL (ref <117)

## 2013-11-01 LAB — COMPREHENSIVE METABOLIC PANEL
AST: 15 U/L (ref 0–37)
Alkaline Phosphatase: 91 U/L (ref 39–117)
BUN: 15 mg/dL (ref 6–23)
CO2: 30 mEq/L (ref 19–32)
Calcium: 9.9 mg/dL (ref 8.4–10.5)
Chloride: 103 mEq/L (ref 96–112)
Glucose, Bld: 86 mg/dL (ref 70–99)
Potassium: 4 mEq/L (ref 3.5–5.1)
Sodium: 141 mEq/L (ref 135–145)

## 2013-11-01 LAB — T4, FREE: Free T4: 1.11 ng/dL (ref 0.80–1.80)

## 2013-11-01 MED ORDER — SODIUM CHLORIDE 0.9 % IJ SOLN
10.0000 mL | Freq: Once | INTRAMUSCULAR | Status: AC
  Administered 2013-11-01: 10 mL via INTRAVENOUS

## 2013-11-01 MED ORDER — SODIUM CHLORIDE 0.9 % IJ SOLN: 10.0000 mL | Freq: Once | INTRAMUSCULAR | Status: DC

## 2013-11-01 MED ORDER — COSYNTROPIN 0.25 MG IJ SOLR
0.2500 mg | Freq: Once | INTRAMUSCULAR | Status: AC
  Administered 2013-11-01: 0.25 mg via INTRAVENOUS
  Filled 2013-11-01: qty 0.25

## 2013-11-02 LAB — CORTISOL-AM, BLOOD: Cortisol - AM: 6.6 ug/dL (ref 4.3–22.4)

## 2013-11-03 LAB — ACTH STIMULATION, 3 TIME POINTS
Cortisol, 60 Min: 15.5 ug/dL — ABNORMAL LOW (ref >20)
Cortisol, Base: 6.6 ug/dL

## 2013-11-03 NOTE — Progress Notes (Signed)
Results for TAEVEON, KEESLING (MRN [id number]) as of 11/03/2013 08:03  Ref. Range 11/02/2013 09:40  Cortisol, Base No range found 6.6  Cortisol, 30 Min Latest Range: >20.0 ug/dL 40.1 (L)  Cortisol, 60 Min Latest Range: >20 ug/dL 02.7 (L)  Results for GRAYCEN, SADLON (MRN [id number]) as of 11/03/2013 08:03  Ref. Range 11/01/2013 08:30  Sodium Latest Range: 135-145 mEq/L 141  Potassium Latest Range: 3.5-5.1 mEq/L 4.0  Chloride Latest Range: 96-112 mEq/L 103  CO2 Latest Range: 19-32 mEq/L 30  Mean Plasma Glucose Latest Range: <117 mg/dL 474  BUN Latest Range: 6-23 mg/dL 15  Creatinine Latest Range: 0.50-1.35 mg/dL 2.59  Calcium Latest Range: 8.4-10.5 mg/dL 9.9  GFR calc non Af Amer Latest Range: >90 mL/min >90  GFR calc Af Amer Latest Range: >90 mL/min >90  Glucose Latest Range: 70-99 mg/dL 86  Alkaline Phosphatase Latest Range: 39-117 U/L 91  Albumin Latest Range: 3.5-5.2 g/dL 3.8  AST Latest Range: 0-37 U/L 15  ALT Latest Range: 0-53 U/L 17  Total Protein Latest Range: 6.0-8.3 g/dL 7.0  Total Bilirubin Latest Range: 0.3-1.2 mg/dL 0.3  Cortisol - AM Latest Range: 4.3-22.4 ug/dL 6.6  Hemoglobin D6L Latest Range: <5.7 % 5.2  TSH Latest Range: 0.350-4.500 uIU/mL 0.869  Free T4 Latest Range: 0.80-1.80 ng/dL 8.75

## 2015-05-17 ENCOUNTER — Other Ambulatory Visit (HOSPITAL_COMMUNITY): Payer: Self-pay | Admitting: Internal Medicine

## 2015-05-17 DIAGNOSIS — M545 Low back pain: Secondary | ICD-10-CM

## 2015-05-29 ENCOUNTER — Ambulatory Visit (HOSPITAL_COMMUNITY)
Admission: RE | Admit: 2015-05-29 | Discharge: 2015-05-29 | Disposition: A | Discharge status: Home / Self Care | Payer: 59 | Source: Ambulatory Visit | Attending: Internal Medicine | Admitting: Internal Medicine

## 2015-05-29 DIAGNOSIS — M4806 Spinal stenosis, lumbar region: Secondary | ICD-10-CM | POA: Insufficient documentation

## 2015-05-29 DIAGNOSIS — M5126 Other intervertebral disc displacement, lumbar region: Secondary | ICD-10-CM | POA: Insufficient documentation

## 2015-05-29 DIAGNOSIS — M545 Low back pain: Secondary | ICD-10-CM

## 2015-05-29 DIAGNOSIS — S3992XA Unspecified injury of lower back, initial encounter: Secondary | ICD-10-CM | POA: Insufficient documentation

## 2015-05-29 DIAGNOSIS — Y9389 Activity, other specified: Secondary | ICD-10-CM | POA: Insufficient documentation

## 2015-05-29 DIAGNOSIS — Y990 Civilian activity done for income or pay: Secondary | ICD-10-CM | POA: Diagnosis not present

## 2015-05-29 DIAGNOSIS — X58XXXA Exposure to other specified factors, initial encounter: Secondary | ICD-10-CM | POA: Diagnosis not present

## 2016-06-25 ENCOUNTER — Other Ambulatory Visit (HOSPITAL_COMMUNITY): Payer: Self-pay | Admitting: Nephrology

## 2016-06-25 DIAGNOSIS — N183 Chronic kidney disease, stage 3 unspecified: Secondary | ICD-10-CM

## 2016-07-17 ENCOUNTER — Ambulatory Visit (HOSPITAL_COMMUNITY)
Admission: RE | Admit: 2016-07-17 | Discharge: 2016-07-17 | Disposition: A | Discharge status: Home / Self Care | Payer: Commercial Managed Care - PPO | Source: Ambulatory Visit | Attending: Nephrology | Admitting: Nephrology

## 2016-07-17 DIAGNOSIS — N183 Chronic kidney disease, stage 3 unspecified: Secondary | ICD-10-CM

## 2016-11-27 ENCOUNTER — Inpatient Hospital Stay (HOSPITAL_COMMUNITY)
Admission: EM | Admit: 2016-11-27 | Discharge: 2016-11-29 | DRG: 581 | Disposition: A | Discharge status: Home / Self Care | Payer: Commercial Managed Care - PPO | Attending: General Surgery | Admitting: General Surgery

## 2016-11-27 ENCOUNTER — Encounter (HOSPITAL_COMMUNITY): Payer: Self-pay | Admitting: Cardiology

## 2016-11-27 ENCOUNTER — Emergency Department (HOSPITAL_COMMUNITY): Payer: Commercial Managed Care - PPO

## 2016-11-27 DIAGNOSIS — K610 Anal abscess: Secondary | ICD-10-CM

## 2016-11-27 DIAGNOSIS — K219 Gastro-esophageal reflux disease without esophagitis: Secondary | ICD-10-CM | POA: Diagnosis present

## 2016-11-27 DIAGNOSIS — G4730 Sleep apnea, unspecified: Secondary | ICD-10-CM | POA: Diagnosis present

## 2016-11-27 DIAGNOSIS — I10 Essential (primary) hypertension: Secondary | ICD-10-CM | POA: Diagnosis present

## 2016-11-27 DIAGNOSIS — L02215 Cutaneous abscess of perineum: Secondary | ICD-10-CM | POA: Diagnosis present

## 2016-11-27 DIAGNOSIS — F17210 Nicotine dependence, cigarettes, uncomplicated: Secondary | ICD-10-CM | POA: Diagnosis present

## 2016-11-27 DIAGNOSIS — N340 Urethral abscess: Secondary | ICD-10-CM | POA: Diagnosis present

## 2016-11-27 DIAGNOSIS — L0291 Cutaneous abscess, unspecified: Secondary | ICD-10-CM | POA: Diagnosis present

## 2016-11-27 DIAGNOSIS — Z7982 Long term (current) use of aspirin: Secondary | ICD-10-CM

## 2016-11-27 LAB — BASIC METABOLIC PANEL
Anion gap: 8 (ref 5–15)
BUN: 13 mg/dL (ref 6–20)
CO2: 28 mmol/L (ref 22–32)
CREATININE: 0.77 mg/dL (ref 0.61–1.24)
Calcium: 9.1 mg/dL (ref 8.9–10.3)
Chloride: 102 mmol/L (ref 101–111)
GFR calc Af Amer: >60 mL/min (ref >60)
GFR calc non Af Amer: >60 mL/min (ref >60)
GLUCOSE: 111 mg/dL — AB (ref 65–99)
Potassium: 3.9 mmol/L (ref 3.5–5.1)
Sodium: 138 mmol/L (ref 135–145)

## 2016-11-27 LAB — CBC WITH DIFFERENTIAL/PLATELET
Basophils Absolute: 0 10*3/uL (ref 0.0–0.1)
Basophils Relative: 0 %
Eosinophils Absolute: 0 10*3/uL (ref 0.0–0.7)
Eosinophils Relative: 0 %
HCT: 44.2 % (ref 39.0–52.0)
HEMOGLOBIN: 15.3 g/dL (ref 13.0–17.0)
LYMPHS ABS: 1.1 10*3/uL (ref 0.7–4.0)
Lymphocytes Relative: 5 %
MCH: 31.2 pg (ref 26.0–34.0)
MCHC: 34.6 g/dL (ref 30.0–36.0)
MCV: 90.2 fL (ref 78.0–100.0)
MONOS PCT: 9 %
Monocytes Absolute: 2 10*3/uL — ABNORMAL HIGH (ref 0.1–1.0)
NEUTROS ABS: 19.5 10*3/uL — AB (ref 1.7–7.7)
NEUTROS PCT: 86 %
Platelets: 206 10*3/uL (ref 150–400)
RBC: 4.9 MIL/uL (ref 4.22–5.81)
RDW: 13.1 % (ref 11.5–15.5)
WBC: 22.6 10*3/uL — ABNORMAL HIGH (ref 4.0–10.5)

## 2016-11-27 LAB — URINALYSIS, ROUTINE W REFLEX MICROSCOPIC
BILIRUBIN URINE: NEGATIVE
Glucose, UA: NEGATIVE mg/dL
HGB URINE DIPSTICK: NEGATIVE
KETONES UR: NEGATIVE mg/dL
Leukocytes, UA: NEGATIVE
NITRITE: NEGATIVE
PROTEIN: NEGATIVE mg/dL
pH: 7 (ref 5.0–8.0)

## 2016-11-27 LAB — LACTIC ACID, PLASMA
Lactic Acid, Venous: 0.9 mmol/L (ref 0.5–1.9)
Lactic Acid, Venous: 0.7 mmol/L (ref 0.5–1.9)

## 2016-11-27 MED ORDER — MIDAZOLAM HCL 2 MG/2ML IJ SOLN: 5.0000 mg | Freq: Once | INTRAMUSCULAR | Status: DC

## 2016-11-27 MED ORDER — FENTANYL CITRATE (PF) 100 MCG/2ML IJ SOLN
INTRAMUSCULAR | Status: AC | PRN
  Administered 2016-11-27: 50 ug via INTRAVENOUS

## 2016-11-27 MED ORDER — ONDANSETRON HCL 4 MG/2ML IJ SOLN: 4.0000 mg | Freq: Four times a day (QID) | INTRAMUSCULAR | Status: DC | PRN

## 2016-11-27 MED ORDER — NICOTINE 21 MG/24HR TD PT24
21.0000 mg | MEDICATED_PATCH | Freq: Every day | TRANSDERMAL | Status: DC
  Administered 2016-11-27 – 2016-11-29 (×3): 21 mg via TRANSDERMAL
  Filled 2016-11-27 (×3): qty 1

## 2016-11-27 MED ORDER — LORAZEPAM 2 MG/ML IJ SOLN: 1.0000 mg | INTRAMUSCULAR | Status: DC | PRN

## 2016-11-27 MED ORDER — VANCOMYCIN HCL IN DEXTROSE 1-5 GM/200ML-% IV SOLN
1000.0000 mg | Freq: Three times a day (TID) | INTRAVENOUS | Status: DC
  Administered 2016-11-27 – 2016-11-29 (×5): 1000 mg via INTRAVENOUS
  Filled 2016-11-27 (×3): qty 200

## 2016-11-27 MED ORDER — PIPERACILLIN-TAZOBACTAM 3.375 G IVPB 30 MIN
3.3750 g | Freq: Once | INTRAVENOUS | Status: AC
  Administered 2016-11-27: 3.375 g via INTRAVENOUS
  Filled 2016-11-27: qty 50

## 2016-11-27 MED ORDER — LIDOCAINE HCL (PF) 1 % IJ SOLN
INTRAMUSCULAR | Status: AC
Start: 2016-11-27 — End: 2016-11-28
  Filled 2016-11-27: qty 5

## 2016-11-27 MED ORDER — MIDAZOLAM HCL 5 MG/5ML IJ SOLN
INTRAMUSCULAR | Status: AC
  Filled 2016-11-27: qty 5

## 2016-11-27 MED ORDER — VANCOMYCIN HCL IN DEXTROSE 1-5 GM/200ML-% IV SOLN
1000.0000 mg | Freq: Once | INTRAVENOUS | Status: AC
  Administered 2016-11-27: 1000 mg via INTRAVENOUS

## 2016-11-27 MED ORDER — HYDROMORPHONE HCL 1 MG/ML IJ SOLN
1.0000 mg | INTRAMUSCULAR | Status: DC | PRN
  Administered 2016-11-28 (×3): 1 mg via INTRAVENOUS
  Filled 2016-11-27 (×3): qty 1

## 2016-11-27 MED ORDER — MIDAZOLAM HCL 5 MG/5ML IJ SOLN
INTRAMUSCULAR | Status: AC | PRN
  Administered 2016-11-27: 5 mg via INTRAVENOUS

## 2016-11-27 MED ORDER — ACETAMINOPHEN 325 MG PO TABS
650.0000 mg | ORAL_TABLET | Freq: Four times a day (QID) | ORAL | Status: DC | PRN
Start: 2016-11-27 — End: 2016-11-29

## 2016-11-27 MED ORDER — FENTANYL CITRATE (PF) 100 MCG/2ML IJ SOLN: 100.0000 ug | Freq: Once | INTRAMUSCULAR | Status: DC

## 2016-11-27 MED ORDER — ENOXAPARIN SODIUM 40 MG/0.4ML ~~LOC~~ SOLN
40.0000 mg | SUBCUTANEOUS | Status: DC
  Administered 2016-11-28 – 2016-11-29 (×2): 40 mg via SUBCUTANEOUS
  Filled 2016-11-27 (×2): qty 0.4

## 2016-11-27 MED ORDER — PIPERACILLIN-TAZOBACTAM 3.375 G IVPB 30 MIN
3.3750 g | Freq: Three times a day (TID) | INTRAVENOUS | Status: DC
  Administered 2016-11-27 – 2016-11-29 (×5): 3.375 g via INTRAVENOUS
  Filled 2016-11-27 (×13): qty 50

## 2016-11-27 MED ORDER — SODIUM CHLORIDE 0.9 % IV SOLN
1000.0000 mL | Freq: Once | INTRAVENOUS | Status: AC
  Administered 2016-11-27: 1000 mL via INTRAVENOUS

## 2016-11-27 MED ORDER — SODIUM CHLORIDE 0.9 % IV SOLN
1000.0000 mL | INTRAVENOUS | Status: DC
  Administered 2016-11-27 (×2): 1000 mL via INTRAVENOUS

## 2016-11-27 MED ORDER — HYDROMORPHONE HCL 1 MG/ML IJ SOLN
1.0000 mg | Freq: Once | INTRAMUSCULAR | Status: AC
  Administered 2016-11-27: 1 mg via INTRAVENOUS
  Filled 2016-11-27: qty 1

## 2016-11-27 MED ORDER — PREDNISONE 10 MG PO TABS
10.0000 mg | ORAL_TABLET | Freq: Every day | ORAL | Status: DC
  Administered 2016-11-28 – 2016-11-29 (×2): 10 mg via ORAL
  Filled 2016-11-27 (×2): qty 1

## 2016-11-27 MED ORDER — IOPAMIDOL (ISOVUE-300) INJECTION 61%
100.0000 mL | Freq: Once | INTRAVENOUS | Status: AC | PRN
  Administered 2016-11-27: 100 mL via INTRAVENOUS

## 2016-11-27 MED ORDER — FENTANYL CITRATE (PF) 100 MCG/2ML IJ SOLN
INTRAMUSCULAR | Status: AC
  Filled 2016-11-27: qty 2

## 2016-11-27 MED ORDER — ACETAMINOPHEN 650 MG RE SUPP: 650.0000 mg | Freq: Four times a day (QID) | RECTAL | Status: DC | PRN

## 2016-11-27 MED ORDER — DIPHENHYDRAMINE HCL 50 MG/ML IJ SOLN: 25.0000 mg | Freq: Four times a day (QID) | INTRAMUSCULAR | Status: DC | PRN

## 2016-11-27 MED ORDER — MIDAZOLAM HCL 5 MG/ML IJ SOLN: 5.0000 mg | Freq: Once | INTRAMUSCULAR | Status: DC

## 2016-11-27 MED ORDER — ONDANSETRON 4 MG PO TBDP: 4.0000 mg | ORAL_TABLET | Freq: Four times a day (QID) | ORAL | Status: DC | PRN

## 2016-11-27 MED ORDER — DIPHENHYDRAMINE HCL 25 MG PO CAPS: 25.0000 mg | ORAL_CAPSULE | Freq: Four times a day (QID) | ORAL | Status: DC | PRN

## 2016-11-27 MED ORDER — LORAZEPAM 2 MG/ML IJ SOLN
INTRAMUSCULAR | Status: AC
  Filled 2016-11-27: qty 1

## 2016-11-27 MED ORDER — BACITRACIN-NEOMYCIN-POLYMYXIN 400-5-5000 EX OINT
TOPICAL_OINTMENT | CUTANEOUS | Status: AC
  Filled 2016-11-27: qty 1

## 2016-11-27 MED ORDER — OXYCODONE-ACETAMINOPHEN 5-325 MG PO TABS
1.0000 | ORAL_TABLET | ORAL | Status: DC | PRN
  Administered 2016-11-28: 1 via ORAL
  Administered 2016-11-29: 2 via ORAL
  Filled 2016-11-27: qty 1
  Filled 2016-11-27: qty 2

## 2016-11-27 MED ORDER — LORAZEPAM 2 MG/ML IJ SOLN
1.0000 mg | Freq: Once | INTRAMUSCULAR | Status: AC
  Administered 2016-11-27: 1 mg via INTRAVENOUS

## 2016-11-27 NOTE — ED Notes (Signed)
Patient transported to Ultrasound 

## 2016-11-27 NOTE — ED Triage Notes (Signed)
Swelling right inner thigh, groin, testicle  area since last Tuesday.  C/o pain with urination when symptoms started.  C/o fever also

## 2016-11-27 NOTE — Progress Notes (Signed)
Pharmacy Antibiotic Note  Tony Meadows is a 50 y.o. male admitted on 11/27/2016 with cellulitis.  Pharmacy has been consulted for vancomycin dosing. Vancomycin 1 gm given earlier today.  He is also receiving zosyn.  Plan: Vancomycin 1gm IV q8 hours F/u renal function, cultures and clinical co  Height: 5\' 10"  (177.8 cm) Weight: 199 lb 4.8 oz (90.4 kg) IBW/kg (Calculated) : 73  Temp (24hrs), Avg:99.5 F (37.5 C), Min:98.3 F (36.8 C), Max:100.7 F (38.2 C)   Recent Labs Lab 11/27/16 1055 11/27/16 1345 11/27/16 1652  WBC 22.6*  --   --   CREATININE 0.77  --   --   LATICACIDVEN  --  0.9 0.7    Estimated Creatinine Clearance: 122.2 mL/min (by C-G formula based on SCr of 0.77 mg/dL).    No Known Allergies  Antimicrobials this admission: vanc 12/8>>  zoxyn 12/8 >>    Thank you for allowing pharmacy to be a part of this patient's care.  Woodfin GanjaSeay, Taviana Westergren [place] 11/27/2016 9:16 PM

## 2016-11-27 NOTE — ED Notes (Signed)
Pt request medication for his nerves. EDP aware.

## 2016-11-27 NOTE — ED Notes (Signed)
I elevated the patient's HOB and he respirations are currently running between 20-22 breaths per minute, patient is alert and oriented, VSS.

## 2016-11-27 NOTE — ED Notes (Signed)
Return from CT

## 2016-11-27 NOTE — ED Notes (Signed)
Pt testicles are swollen. Pt has hard red , swollen area to buttocks. States he noticed it over a week ago.

## 2016-11-27 NOTE — ED Provider Notes (Signed)
I spoke with Dr. Thomas of general surgery [facility] who recommends that the patient be [facility]  Spoke with Dr[facility] Franky MachoMark Jenkins, general surgery, [facility] who will evaluate the patient the bedside.  Patient remained stable.   Azalia BilisKevin Evelen Vazguez, MD 11/27/16 [phone]

## 2016-11-27 NOTE — ED Notes (Signed)
Pt back to CT

## 2016-11-27 NOTE — ED Notes (Signed)
Pt return from US  

## 2016-11-27 NOTE — H&P (Signed)
Tony Meadows is an 50 y.o. male.   Chief Complaint: Perineal abscess HPI: Patient is a 50 year old white male who presented emergency room with a several-day history of worsening pain and swelling in the perineal region just to the right and posterior to the scrotum. He has never had this happen before. Pain is made worse with touching it. His pain is 7 out of 10. No drainage has been noted.  Past Medical History:  Diagnosis Date  . Arthritis    Rheumatoid  . Bulging disc   . Complication of anesthesia    pt states that he had a lot of back pain after spinal with hernia repair  . GERD (gastroesophageal reflux disease)   . Hypertension   . Shortness of breath   . Sleep apnea    STOP BANG score = 4    Past Surgical History:  Procedure Laterality Date  . CHOLECYSTECTOMY  10/12/2012   Procedure: LAPAROSCOPIC CHOLECYSTECTOMY;  Surgeon: Jamesetta So, MD;  Location: [facility];  Service: General;  Laterality: N/A;  . ESOPHAGOGASTRODUODENOSCOPY  10/04/2012   Tony Meadows:gastritis, moderate  . HERNIA REPAIR    . Left foot repair     due to chain saw accident  . TONSILLECTOMY      History reviewed. No pertinent family history. Social History:  reports that he has been smoking Cigarettes.  He has a 70.00 pack-year smoking history. He does not have any smokeless tobacco history on file. He reports that he does not drink alcohol or use drugs.  Allergies: No Known Allergies   (Not in a hospital admission)  Results for orders placed or performed during the hospital encounter of 11/27/16 (from the past 48 hour(s))  CBC with Differential     Status: Abnormal   Collection Time: 11/27/16 10:55 AM  Result Value Ref Range   WBC 22.6 (H) 4.0 - 10.5 K/uL   RBC 4.90 4.22 - 5.81 MIL/uL   Hemoglobin 15.3 13.0 - 17.0 g/dL   HCT 44.2 39.0 - 52.0 %   MCV 90.2 78.0 - 100.0 fL   MCH 31.2 26.0 - 34.0 pg   MCHC 34.6 30.0 - 36.0 g/dL   RDW 13.1 11.5 - 15.5 %   Platelets 206 150 - 400 K/uL   Neutrophils  Relative % 86 %   Neutro Abs 19.5 (H) 1.7 - 7.7 K/uL   Lymphocytes Relative 5 %   Lymphs Abs 1.1 0.7 - 4.0 K/uL   Monocytes Relative 9 %   Monocytes Absolute 2.0 (H) 0.1 - 1.0 K/uL   Eosinophils Relative 0 %   Eosinophils Absolute 0.0 0.0 - 0.7 K/uL   Basophils Relative 0 %   Basophils Absolute 0.0 0.0 - 0.1 K/uL  Basic metabolic panel     Status: Abnormal   Collection Time: 11/27/16 10:55 AM  Result Value Ref Range   Sodium 138 135 - 145 mmol/L   Potassium 3.9 3.5 - 5.1 mmol/L   Chloride 102 101 - 111 mmol/L   CO2 28 22 - 32 mmol/L   Glucose, Bld 111 (H) 65 - 99 mg/dL   BUN 13 6 - 20 mg/dL   Creatinine, Ser 0.77 0.61 - 1.24 mg/dL   Calcium 9.1 8.9 - 10.3 mg/dL   GFR calc non Af Amer >60 >60 mL/min   GFR calc Af Amer >60 >60 mL/min    Comment: (NOTE) The eGFR has been calculated using the CKD EPI equation. This calculation has not been validated in all clinical situations. eGFR's  persistently <60 mL/min signify possible Chronic Kidney Disease.    Anion gap 8 5 - 15  Lactic acid, plasma     Status: None   Collection Time: 11/27/16  1:45 PM  Result Value Ref Range   Lactic Acid, Venous 0.9 0.5 - 1.9 mmol/L  Urinalysis, Routine w reflex microscopic     Status: Abnormal   Collection Time: 11/27/16  3:16 PM  Result Value Ref Range   Color, Urine STRAW (A) YELLOW   APPearance CLEAR CLEAR   Specific Gravity, Urine >1.046 (H) 1.005 - 1.030   pH 7.0 5.0 - 8.0   Glucose, UA NEGATIVE NEGATIVE mg/dL   Hgb urine dipstick NEGATIVE NEGATIVE   Bilirubin Urine NEGATIVE NEGATIVE   Ketones, ur NEGATIVE NEGATIVE mg/dL   Protein, ur NEGATIVE NEGATIVE mg/dL   Nitrite NEGATIVE NEGATIVE   Leukocytes, UA NEGATIVE NEGATIVE  Lactic acid, plasma     Status: None   Collection Time: 11/27/16  4:52 PM  Result Value Ref Range   Lactic Acid, Venous 0.7 0.5 - 1.9 mmol/L   Ct Pelvis W Contrast  Result Date: 11/27/2016 CLINICAL DATA:  Perianal abscess, swelling to right buttocks x 11 days. Hx of  cholecystectomy,hernia repair, htn, gerd/bbj^158m ISOVUE-300 IOPAMIDOL (ISOVUE-300) INJECTION 61% EXAM: CT PELVIS WITH CONTRAST TECHNIQUE: Multidetector CT imaging of the pelvis was performed using the standard protocol following the bolus administration of intravenous contrast. CONTRAST:  1035mISOVUE-300 IOPAMIDOL (ISOVUE-300) INJECTION 61% COMPARISON:  Limited ultrasound of the right polyp on 11/27/2016 FINDINGS: Urinary Tract: Urinary bladder and distal ureters are normal in appearance. Bowel: Normal appearance of small large bowel loops. The rectum has a normal appearance. Vascular/Lymphatic: Enlarged bilateral inguinal lymph nodes are identified, largest on the right measuring 2.0 cm. Reproductive: Seminal vesicles and prostate have a normal appearance. Other: Within the right aspect of the perineum, posterior to the scrotum, there is a rim enhancing collection with air-fluid level measuring 7.4 (anterior to posterior) x 2.1 (medial to lateral) x 4.4 (superior to inferior) cm. The findings are consistent with abscess or phlegmon, similar to the findings identified by ultrasound. Musculoskeletal: No suspicious bone lesions identified. IMPRESSION: 1. Rim enhancing collection in the right perineum measuring 7.4 x 2.1 x 4.4 cm consistent with abscess or phlegmon. 2. Probably reactive inguinal lymph nodes, largest on the right measuring 2.0 cm. Electronically Signed   By: ElNolon Nations.D.   On: 11/27/2016 15:31   UsKoreaelvis Limited  Result Date: 11/27/2016 CLINICAL DATA:  Right buttocks abscess. EXAM: LIMITED ULTRASOUND OF PELVIS TECHNIQUE: Limited transabdominal ultrasound examination of the pelvis was performed. COMPARISON:  None. FINDINGS: Scanning limited to the right inner buttock region. This area is red and swollen. There is a subcutaneous echogenic structure measuring 2.2 x 1.0 x 1.4 cm. This has ill-defined margins in surrounding edema. This does not represent simple fluid but could represent an  abscess with pus . IMPRESSION: 2.2 x 1.0 x 1.4 cm echogenic structure right buttock in the area of swelling and pain most compatible with abscess. Electronically Signed   By: ChFranchot Gallo.D.   On: 11/27/2016 12:51   Us[place]oreacrotum  Result Date: 11/27/2016 CLINICAL DATA:  Known abscess in the right buttock, pain with urination and right testicular pain EXAM: SCROTAL ULTRASOUND DOPPLER ULTRASOUND OF THE TESTICLES TECHNIQUE: Complete ultrasound examination of the testicles, epididymis, and other scrotal structures was performed. Color and spectral Doppler ultrasound were also utilized to evaluate blood flow to the testicles. COMPARISON:  None. FINDINGS: Right  testicle Measurements: 4.7 x 2.5 x 3.4 cm. No mass or microlithiasis visualized. Left testicle Measurements: 4.0 x 2.1 x 3.9 cm. 2 mm cyst is noted in the upper portion of the left testicle. Right epididymis:  6 mm epididymal cyst is noted. Left epididymis:  1.2 cm epididymal cyst is noted. Hydrocele:  Bilateral hydroceles are seen. Varicocele: Tortuous echogenic foci are noted adjacent to the right testicle with increased vascularity consistent with a varicocele. The increased echogenicity suggests the possibility of partial thrombosis. Pulsed Doppler interrogation of both testes demonstrates normal low resistance arterial and venous waveforms bilaterally. IMPRESSION: Left testicular cyst. Bilateral hydroceles. Bilateral epididymal cysts. Changes on the right consistent with varicocele with likely partial thrombosis. Electronically Signed   By: Inez Catalina M.D.   On: 11/27/2016 13:02   [place] Art/ven Flow Abd Pelv Doppler  Result Date: 11/27/2016 CLINICAL DATA:  Known abscess in the right buttock, pain with urination and right testicular pain EXAM: SCROTAL ULTRASOUND DOPPLER ULTRASOUND OF THE TESTICLES TECHNIQUE: Complete ultrasound examination of the testicles, epididymis, and other scrotal structures was performed. Color and spectral Doppler ultrasound  were also utilized to evaluate blood flow to the testicles. COMPARISON:  None. FINDINGS: Right testicle Measurements: 4.7 x 2.5 x 3.4 cm. No mass or microlithiasis visualized. Left testicle Measurements: 4.0 x 2.1 x 3.9 cm. 2 mm cyst is noted in the upper portion of the left testicle. Right epididymis:  6 mm epididymal cyst is noted. Left epididymis:  1.2 cm epididymal cyst is noted. Hydrocele:  Bilateral hydroceles are seen. Varicocele: Tortuous echogenic foci are noted adjacent to the right testicle with increased vascularity consistent with a varicocele. The increased echogenicity suggests the possibility of partial thrombosis. Pulsed Doppler interrogation of both testes demonstrates normal low resistance arterial and venous waveforms bilaterally. IMPRESSION: Left testicular cyst. Bilateral hydroceles. Bilateral epididymal cysts. Changes on the right consistent with varicocele with likely partial thrombosis. Electronically Signed   By: Inez Catalina M.D.   On: 11/27/2016 13:02    Review of Systems  Constitutional: Positive for fever and malaise/fatigue.  HENT: Negative.   Eyes: Negative.   Respiratory: Negative.   Cardiovascular: Negative.   Gastrointestinal: Negative.   Genitourinary: Negative.   Musculoskeletal: Negative.   Skin:       Tender at base of scrotum extending towards the anus.  Endo/Heme/Allergies: Negative.   All other systems reviewed and are negative.   Blood pressure 121/70, pulse 100, temperature 98.3 F (36.8 C), temperature source Oral, resp. rate (!) 30, height _0  (1.778 m), weight 93 kg (205 lb), SpO2 95 %. Physical Exam  Constitutional: He is oriented to person, place, and time. He appears well-developed and well-nourished. No distress.  HENT:  Head: Normocephalic and atraumatic.  Neck: Normal range of motion. Neck supple.  Cardiovascular: Normal rate, regular rhythm and normal heart sounds.   Respiratory: Effort normal and breath sounds normal.  GI: Soft.  Bowel sounds are normal. He exhibits no distension. There is no tenderness.  Neurological: He is alert and oriented to person, place, and time.  Skin:  Fluctuant erythematous area approximately 5 cm in an ovoid length present just to the right and inferior to the base of the scrotum. Tender to touch. No drainage noted.     Assessment/Plan Impression: Perineal abscess Plan: Did discuss the case with urology. They are available should this involve the genitourinary system. Patient states he is able to void without difficulty. Please see procedure note. This was drained at bedside under monitored  anesthesia care. Purulent fluid was obtained. Cultures were sent. The patient will be admitted to the hospital for IV antibiotics, pain control, and wound care.  Jamesetta So, MD 11/27/2016, 6:58 PM

## 2016-11-27 NOTE — ED Notes (Signed)
Pt to be evaluated by surgery. Pt aware.

## 2016-11-27 NOTE — Procedures (Signed)
Incision and Drainage Procedure Note  Pre-operative Diagnosis: Perineal abscess  Post-operative Diagnosis: same  Indications: Patient is a 50 year old white male who presented emergency room with perianal pain. He was noted to have leukocytosis and on CT scan has a large perineal abscess extending to the base of the scrotum. The risks and benefits of the procedure were fully explained to the patient, who gave informed consent.  Anesthesia: 1% plain lidocaine  Procedure Details  The procedure, risks and complications have been discussed in detail (including, but not limited to airway compromise, infection, bleeding) with the patient, and the patient has signed consent to the procedure.  The skin was sterilely prepped and draped over the affected area in the usual fashion. After adequate local anesthesia, I&D with a #11 blade was performed on the right perineal region just outside the scrotal base.. Purulent drainage: present.  Cavity cleaned with Betadine. Packed with iodoform Nu Gauze. Cultures taken. The patient was observed until stable.  Findings: Deep perineal abscess  EBL: 5 cc's  Drains: None  Condition: Tolerated procedure well   Complications: none.

## 2016-11-27 NOTE — ED Provider Notes (Signed)
[facility] Provider Note   CSN: [id number] Arrival date & time: 11/27/16  [id number]     History   Chief Complaint Chief Complaint  Patient presents with  . Groin Swelling    HPI Tony Meadows is a [age] y.o. male.  Patient is a 50 year old male who presents to the emergency department with a complaint of swelling of his right buttocks. The patient states that this has been going on for about 11 days, it has gotten worse since Sunday, December 3. The patient states that he now has pain when he walks or when he touches this area. The patient was seen by his primary physician, Dr. Phillips OdorGolding, and sent to the emergency department for additional evaluation. The patient states he's been having some subjective fever, she's also had chills. He gets headaches from time to time that he think is related to this and this is been improved by Advil. He states most recently he has noted a change in his appetite. He has no history of previous injury to the buttocks area 4 to the peritoneal area. He states he's never been diagnosed with methicillin-resistant staph, and had no operations or procedures involving this area. He presents now for evaluation of this area.      Past Medical History:  Diagnosis Date  . Arthritis    Rheumatoid  . Bulging disc   . Complication of anesthesia    pt states that he had a lot of back pain after spinal with hernia repair  . GERD (gastroesophageal reflux disease)   . Hypertension   . Shortness of breath   . Sleep apnea    STOP BANG score = 4    There are no active problems to display for this patient.   Past Surgical History:  Procedure Laterality Date  . CHOLECYSTECTOMY  10/12/2012   Procedure: LAPAROSCOPIC CHOLECYSTECTOMY;  Surgeon: Dalia HeadingMark A Jenkins, MD;  Location: [facility];  Service: General;  Laterality: N/A;  . ESOPHAGOGASTRODUODENOSCOPY  10/04/2012   Jenkins:gastritis, moderate  . HERNIA REPAIR    . Left foot repair     due to chain saw accident  .  TONSILLECTOMY         Home Medications    Prior to Admission medications   Medication Sig Start Date End Date Taking? Authorizing Provider  amLODipine (NORVASC) 5 MG tablet Take 5 mg by mouth daily.    Historical Provider, MD  aspirin 325 MG tablet Take 325 mg by mouth daily.    Historical Provider, MD  DULoxetine (CYMBALTA) 20 MG capsule Take 20 mg by mouth daily.    Historical Provider, MD  esomeprazole (NEXIUM) 40 MG capsule Take 40 mg by mouth daily before breakfast.    Historical Provider, MD  HYDROcodone-acetaminophen (NORCO) 10-325 MG per tablet Take 1 tablet by mouth every 6 (six) hours as needed. For pain 10/12/12   Franky MachoMark Jenkins, MD  Multiple Vitamin (MULTIVITAMIN WITH MINERALS) TABS Take 1 tablet by mouth daily.    Historical Provider, MD  Omega-3 Fatty Acids (FISH OIL TRIPLE STRENGTH) 1400 MG CAPS Take 1,400 mg by mouth 3 (three) times daily.     Historical Provider, MD  predniSONE (DELTASONE) 5 MG tablet Take 5 mg by mouth daily with breakfast.    Historical Provider, MD  zolpidem (AMBIEN) 5 MG tablet Take 5 mg by mouth at bedtime as needed.    Historical Provider, MD    Family History History reviewed. No pertinent family history.  Social History Social History  Substance  Use Topics  . Smoking status: Current Every Day Smoker    Packs/day: 2.00    Years: 35.00    Types: Cigarettes  . Smokeless tobacco: Not on file  . Alcohol use No     Allergies   Patient has no known allergies.   Review of Systems Review of Systems  Constitutional: Negative for activity change.       All ROS Neg except as noted in HPI  HENT: Negative for nosebleeds.   Eyes: Negative for photophobia and discharge.  Respiratory: Negative for cough, shortness of breath and wheezing.   Cardiovascular: Negative for chest pain and palpitations.  Gastrointestinal: Negative for abdominal pain and blood in stool.  Genitourinary: Negative for dysuria, frequency and hematuria.  Musculoskeletal:  Positive for arthralgias and back pain. Negative for neck pain.  Skin: Positive for wound.  Neurological: Negative for dizziness, seizures and speech difficulty.  Psychiatric/Behavioral: Negative for confusion and hallucinations.     Physical Exam Updated Vital Signs BP 105/75   Pulse 93   Temp 99.4 F (37.4 C) (Oral)   Resp 18   Ht 5\' 10"  (1.778 m)   Wt 93 kg   SpO2 93%   BMI 29.41 kg/m   Physical Exam  Constitutional: He is oriented to person, place, and time. He appears well-developed and well-nourished.  Non-toxic appearance.  HENT:  Head: Normocephalic.  Right Ear: Tympanic membrane and external ear normal.  Left Ear: Tympanic membrane and external ear normal.  Eyes: EOM and lids are normal. Pupils are equal, round, and reactive to light.  Neck: Normal range of motion. Neck supple. Carotid bruit is not present.  Cardiovascular: Normal rate, regular rhythm, normal heart sounds, intact distal pulses and normal pulses.   Pulmonary/Chest: Breath sounds normal. No respiratory distress.  Abdominal: Soft. Bowel sounds are normal. There is no tenderness. There is no guarding.  Genitourinary:  Genitourinary Comments: There is an abscess of the right buttocks cheek. The anus does not appear to be involved. There is no drainage present. There is noted increased areas of warmth. The redness extends to the perineal area between the buttocks and the scrotum. This area is swollen and red and full. It is tender to touch. There is no tenderness of the right or left testicle. There is no swelling of the scrotal area. There is no drainage from the penis.  Musculoskeletal: Normal range of motion.  Lymphadenopathy:       Head (right side): No submandibular adenopathy present.       Head (left side): No submandibular adenopathy present.    He has no cervical adenopathy.  Neurological: He is alert and oriented to person, place, and time. He has normal strength. No cranial nerve deficit or sensory  deficit.  Skin: Skin is warm and dry.  Psychiatric: He has a normal mood and affect. His speech is normal.  Nursing note and vitals reviewed.    ED Treatments / Results  Labs (all labs ordered are listed, but only abnormal results are displayed) Labs Reviewed - No data to display  EKG  EKG Interpretation None       Radiology No results found.  Procedures Procedures (including critical care time)  Medications Ordered in ED Medications - No data to display   Initial Impression / Assessment and Plan / ED Course  I have reviewed the triage vital signs and the nursing notes.  Pertinent labs & imaging results that were available during my care of the patient were reviewed by  me and considered in my medical decision making (see chart for details).  Clinical Course     *I have reviewed nursing notes, vital signs, and all appropriate lab and imaging results for this patient.**  Final Clinical Impressions(s) / ED Diagnoses  Vital signs reviewed. Complete blood count reveals a white blood cell count of over 22,000. Basic metabolic panel is essentially within normal limits.  Ultrasound of the scrotum reveals changes on the right with your [place] and a partial thrombosis. X-ray of the pelvis reveals a 2.2 x 1 x 1.4 cm structure of the right buttocks consistent with an abscess.  Concern for abscess vs onset of Fortier's grangrene.  Patient does not meet criteria at this time for sepsis. Patient started on vancomycin and Zosyn. Patient seen with me by Dr. Patria Mane. Case was discussed with general surgery and urology by Dr. Patria Mane. CT scan of the pelvis has been ordered. Patient's care will be continued by Dr. Patria Mane.    Final diagnoses:  None    New Prescriptions New Prescriptions   No medications on file     Ivery Quale, PA-C 11/27/16 1359    Ivery Quale, PA-C 11/27/16 1542    Azalia Bilis, MD 11/27/16 1704

## 2016-11-27 NOTE — ED Notes (Signed)
Pt up to bathroom. Waiting on disposition.

## 2016-11-28 LAB — CBC
HCT: 42.4 % (ref 39.0–52.0)
Hemoglobin: 14.2 g/dL (ref 13.0–17.0)
MCH: 30.8 pg (ref 26.0–34.0)
MCHC: 33.5 g/dL (ref 30.0–36.0)
MCV: 92 fL (ref 78.0–100.0)
Platelets: 221 K/uL (ref 150–400)
RBC: 4.61 MIL/uL (ref 4.22–5.81)
RDW: 13.4 % (ref 11.5–15.5)
WBC: 22.5 K/uL — ABNORMAL HIGH (ref 4.0–10.5)

## 2016-11-28 LAB — BASIC METABOLIC PANEL WITH GFR
Anion gap: 8 (ref 5–15)
BUN: 11 mg/dL (ref 6–20)
CO2: 27 mmol/L (ref 22–32)
Calcium: 8.8 mg/dL — ABNORMAL LOW (ref 8.9–10.3)
Chloride: 101 mmol/L (ref 101–111)
Creatinine, Ser: 0.85 mg/dL (ref 0.61–1.24)
GFR calc Af Amer: >60 mL/min
GFR calc non Af Amer: >60 mL/min
Glucose, Bld: 105 mg/dL — ABNORMAL HIGH (ref 65–99)
Potassium: 3.5 mmol/L (ref 3.5–5.1)
Sodium: 136 mmol/L (ref 135–145)

## 2016-11-28 MED ORDER — ALPRAZOLAM 0.5 MG PO TABS
0.5000 mg | ORAL_TABLET | Freq: Three times a day (TID) | ORAL | Status: DC | PRN
  Administered 2016-11-28: 0.5 mg via ORAL
  Filled 2016-11-28: qty 1

## 2016-11-28 NOTE — Progress Notes (Signed)
Subjective: Patient still has some pain in the perineum, but he wants to go home.  Objective: Vital signs in last 24 hours: Temp:  [98.1 F (36.7 C)-100.7 F (38.2 C)] 98.1 F (36.7 C) (12/09 0515) Pulse Rate:  [65-105] 84 (12/09 0515) Resp:  [16-37] 20 (12/09 0515) BP: (102-134)/(54-94) 102/56 (12/09 0515) SpO2:  [92 %-100 %] 94 % (12/09 0515) Weight:  [90.4 kg (199 lb 4.8 oz)] 90.4 kg (199 lb 4.8 oz) (12/08 2043)    Intake/Output from previous day: 12/08 0701 - 12/09 0700 In: 1500 [I.V.:1000; IV Piggyback:500] Out: -  Intake/Output this shift: No intake/output data recorded.  General appearance: alert, cooperative and no distress Resp: clear to auscultation bilaterally Cardio: regular rate and rhythm, S1, S2 normal, no murmur, click, rub or gallop Male genitalia: Dressing in place at incision and drainage site. Packing in place.  Lab Results:   Recent Labs  11/27/16 1055 11/28/16 0550  WBC 22.6* 22.5*  HGB 15.3 14.2  HCT 44.2 42.4  PLT 206 221   BMET  Recent Labs  11/27/16 1055 11/28/16 0550  NA 138 136  K 3.9 3.5  CL 102 101  CO2 28 27  GLUCOSE 111* 105*  BUN 13 11  CREATININE 0.77 0.85  CALCIUM 9.1 8.8*   PT/INR No results for input(s): LABPROT, INR in the last 72 hours.  Studies/Results: Ct Pelvis W Contrast  Result Date: 11/27/2016 CLINICAL DATA:  Perianal abscess, swelling to right buttocks x 11 days. Hx of cholecystectomy,hernia repair, htn, gerd/bbj^17300mL ISOVUE-300 IOPAMIDOL (ISOVUE-300) INJECTION 61% EXAM: CT PELVIS WITH CONTRAST TECHNIQUE: Multidetector CT imaging of the pelvis was performed using the standard protocol following the bolus administration of intravenous contrast. CONTRAST:  100mL ISOVUE-300 IOPAMIDOL (ISOVUE-300) INJECTION 61% COMPARISON:  Limited ultrasound of the right polyp on 11/27/2016 FINDINGS: Urinary Tract: Urinary bladder and distal ureters are normal in appearance. Bowel: Normal appearance of small large bowel loops.  The rectum has a normal appearance. Vascular/Lymphatic: Enlarged bilateral inguinal lymph nodes are identified, largest on the right measuring 2.0 cm. Reproductive: Seminal vesicles and prostate have a normal appearance. Other: Within the right aspect of the perineum, posterior to the scrotum, there is a rim enhancing collection with air-fluid level measuring 7.4 (anterior to posterior) x 2.1 (medial to lateral) x 4.4 (superior to inferior) cm. The findings are consistent with abscess or phlegmon, similar to the findings identified by ultrasound. Musculoskeletal: No suspicious bone lesions identified. IMPRESSION: 1. Rim enhancing collection in the right perineum measuring 7.4 x 2.1 x 4.4 cm consistent with abscess or phlegmon. 2. Probably reactive inguinal lymph nodes, largest on the right measuring 2.0 cm. Electronically Signed   By: Norva PavlovElizabeth  Brown M.D.   On: 11/27/2016 15:31   [place] Pelvis Limited  Result Date: 11/27/2016 CLINICAL DATA:  Right buttocks abscess. EXAM: LIMITED ULTRASOUND OF PELVIS TECHNIQUE: Limited transabdominal ultrasound examination of the pelvis was performed. COMPARISON:  None. FINDINGS: Scanning limited to the right inner buttock region. This area is red and swollen. There is a subcutaneous echogenic structure measuring 2.2 x 1.0 x 1.4 cm. This has ill-defined margins in surrounding edema. This does not represent simple fluid but could represent an abscess with pus . IMPRESSION: 2.2 x 1.0 x 1.4 cm echogenic structure right buttock in the area of swelling and pain most compatible with abscess. Electronically Signed   By: Marlan Palauharles  Clark M.D.   On: 11/27/2016 12:51   [place] Scrotum  Result Date: 11/27/2016 CLINICAL DATA:  Known abscess in the right  buttock, pain with urination and right testicular pain EXAM: SCROTAL ULTRASOUND DOPPLER ULTRASOUND OF THE TESTICLES TECHNIQUE: Complete ultrasound examination of the testicles, epididymis, and other scrotal structures was performed. Color and  spectral Doppler ultrasound were also utilized to evaluate blood flow to the testicles. COMPARISON:  None. FINDINGS: Right testicle Measurements: 4.7 x 2.5 x 3.4 cm. No mass or microlithiasis visualized. Left testicle Measurements: 4.0 x 2.1 x 3.9 cm. 2 mm cyst is noted in the upper portion of the left testicle. Right epididymis:  6 mm epididymal cyst is noted. Left epididymis:  1.2 cm epididymal cyst is noted. Hydrocele:  Bilateral hydroceles are seen. Varicocele: Tortuous echogenic foci are noted adjacent to the right testicle with increased vascularity consistent with a varicocele. The increased echogenicity suggests the possibility of partial thrombosis. Pulsed Doppler interrogation of both testes demonstrates normal low resistance arterial and venous waveforms bilaterally. IMPRESSION: Left testicular cyst. Bilateral hydroceles. Bilateral epididymal cysts. Changes on the right consistent with varicocele with likely partial thrombosis. Electronically Signed   By: Alcide CleverMark  Lukens M.D.   On: 11/27/2016 13:02   [place] Art/ven Flow Abd Pelv Doppler  Result Date: 11/27/2016 CLINICAL DATA:  Known abscess in the right buttock, pain with urination and right testicular pain EXAM: SCROTAL ULTRASOUND DOPPLER ULTRASOUND OF THE TESTICLES TECHNIQUE: Complete ultrasound examination of the testicles, epididymis, and other scrotal structures was performed. Color and spectral Doppler ultrasound were also utilized to evaluate blood flow to the testicles. COMPARISON:  None. FINDINGS: Right testicle Measurements: 4.7 x 2.5 x 3.4 cm. No mass or microlithiasis visualized. Left testicle Measurements: 4.0 x 2.1 x 3.9 cm. 2 mm cyst is noted in the upper portion of the left testicle. Right epididymis:  6 mm epididymal cyst is noted. Left epididymis:  1.2 cm epididymal cyst is noted. Hydrocele:  Bilateral hydroceles are seen. Varicocele: Tortuous echogenic foci are noted adjacent to the right testicle with increased vascularity consistent with  a varicocele. The increased echogenicity suggests the possibility of partial thrombosis. Pulsed Doppler interrogation of both testes demonstrates normal low resistance arterial and venous waveforms bilaterally. IMPRESSION: Left testicular cyst. Bilateral hydroceles. Bilateral epididymal cysts. Changes on the right consistent with varicocele with likely partial thrombosis. Electronically Signed   By: Alcide CleverMark  Lukens M.D.   On: 11/27/2016 13:02    Anti-infectives: Anti-infectives    Start     Dose/Rate Route Frequency Ordered Stop   11/27/16 2200  piperacillin-tazobactam (ZOSYN) IVPB 3.375 g     3.375 g 12.5 mL/hr over 4 Hours Intravenous Every 8 hours 11/27/16 2055     11/27/16 2130  vancomycin (VANCOCIN) IVPB 1000 mg/200 mL premix     1,000 mg 200 mL/hr over 60 Minutes Intravenous Every 8 hours 11/27/16 2119     11/27/16 1345  piperacillin-tazobactam (ZOSYN) IVPB 3.375 g     3.375 g 100 mL/hr over 30 Minutes Intravenous  Once 11/27/16 1333 11/27/16 1430   11/27/16 1330  vancomycin (VANCOCIN) IVPB 1000 mg/200 mL premix     1,000 mg 200 mL/hr over 60 Minutes Intravenous  Once 11/27/16 1320 11/27/16 1554      Assessment/Plan: Impression: Status post incision and drainage of perineal abscess. Cultures pending. Still with leukocytosis, but patient definitely feels better. Plan: I told patient that he needed to stay in the hospital for IV antibiotics until his leukocytosis starts to resolve. We will remove packing in a.m. and reassess.  LOS: 1 day    Nkenge Sonntag A 11/28/2016

## 2016-11-29 LAB — CBC
HCT: 42.5 % (ref 39.0–52.0)
HEMOGLOBIN: 14.4 g/dL (ref 13.0–17.0)
MCH: 30.7 pg (ref 26.0–34.0)
MCHC: 33.9 g/dL (ref 30.0–36.0)
MCV: 90.6 fL (ref 78.0–100.0)
PLATELETS: 238 10*3/uL (ref 150–400)
RBC: 4.69 MIL/uL (ref 4.22–5.81)
RDW: 13.1 % (ref 11.5–15.5)
WBC: 15.2 10*3/uL — AB (ref 4.0–10.5)

## 2016-11-29 MED ORDER — AMOXICILLIN-POT CLAVULANATE 875-125 MG PO TABS: 1.0000 | ORAL_TABLET | Freq: Two times a day (BID) | ORAL | 0 refills | Status: DC

## 2016-11-29 NOTE — Progress Notes (Signed)
Pt IV removed, tolerated well.  Reviewed discharge instructions with pt and answered questions at this time.   

## 2016-11-29 NOTE — Discharge Instructions (Signed)
Perirectal Abscess °Introduction °An abscess is an infected area that contains a collection of pus. A perirectal abscess is an abscess that is near the opening of the anus or around the rectum. A perirectal abscess can cause a lot of pain, especially during bowel movements. °What are the causes? °This condition is almost always caused by an infection that starts in an anal gland. °What increases the risk? °This condition is more likely to develop in: °· People with diabetes or inflammatory bowel disease. °· People whose body defense system (immune system) is weak. °· People who have anal sex. °· People who have a sexually transmitted disease (STD). °· People who have certain kinds of cancers, such as rectal carcinoma, leukemia, or lymphoma. °What are the signs or symptoms? °The main symptom of this condition is pain. The pain may be a throbbing pain that gets worse during bowel movements. Other symptoms include: °· Fever. °· Swelling. °· Redness. °· Bleeding. °· Constipation. °How is this diagnosed? °The condition is diagnosed with a physical exam. If the abscess is not visible, a health care provider may need to place a finger inside the rectum to find the abscess. Sometimes, imaging tests are done to determine the size and location of the abscess. These tests may include: °· An ultrasound. °· An MRI. °· A CT scan. °How is this treated? °This condition is usually treated with incision and drainage surgery. Incision and drainage surgery involves making an incision over the abscess to drain the pus. Treatment may also involve antibiotic medicine, pain medicine, stool softeners, or laxatives. °Follow these instructions at home: °· Take medicines only as directed by your health care provider. °· If you were prescribed an antibiotic, finish all of it even if you start to feel better. °· To relieve pain, try sitting: °¨ In a warm, shallow bath (sitz bath). °¨ On a heating pad with the setting on low. °¨ On an inflatable  donut-shaped cushion. °· Follow any diet instructions as directed by your health care provider. °· Keep all follow-up visits as directed by your health care provider. This is important. °Contact a health care provider if: °· Your abscess is bleeding. °· You have pain, swelling, or redness that is getting worse. °· You are constipated. °· You feel ill. °· You have muscle aches or chills. °· You have a fever. °· Your symptoms return after the abscess has healed. °This information is not intended to replace advice given to you by your health care provider. Make sure you discuss any questions you have with your health care provider. °Document Released: 12/04/2000 Document Revised: 05/14/2016 Document Reviewed: 10/17/2014 °© 2017 Elsevier ° °

## 2016-11-29 NOTE — Discharge Summary (Signed)
Physician Discharge Summary  Patient ID: Tony Meadows MRN: [id number] DOB/AGE: [birth year] 50 y.o.  Admit date: 11/27/2016 Discharge date: 11/29/2016  Admission Diagnoses:Peritoneal abscess  Discharge Diagnoses: Same Active Problems:   Abscess of deep perineal space   Discharged Condition: good  Hospital Course: Patient is a 50 year old white male who presented emergency room with a several-day history of worsening perineal pain which is extending towards the scrotum. This was his first episode. CT scan the abdomen and pelvis revealed a large perineal abscess extending up to the base of the scrotum. Surgery was consulted. Incision and drainage of the perineal abscess was performed at bedside under monitored anesthesia care. He was then admitted to the hospital for further evaluation treatment. He was started on vancomycin and Zosyn. Initial Gram stain showed multiple organisms. Today, the packing was removed and minimal purulent drainage was noted. There is still induration in this region. The patient feels much better. He has had no fevers over the past 24 hours. His leukocytosis is normalizing. He is being discharged home in good and improving condition.  Treatments: Incision and drainage of perineal abscess on 11/27/2016  Discharge Exam: Blood pressure 114/63, pulse 79, temperature 98.5 F (36.9 C), temperature source Oral, resp. rate 20, height 5\' 10"  (1.778 m), weight 90.4 kg (199 lb 4.8 oz), SpO2 91 %. General appearance: alert, cooperative and no distress Resp: clear to auscultation bilaterally Cardio: regular rate and rhythm, S1, S2 normal, no murmur, click, rub or gallop Male genitalia: Draining perineal wound.  Disposition: 01-Home or Self Care     Medication List    TAKE these medications   ALPRAZolam 1 MG tablet Commonly known as:  XANAX Take 1 mg by mouth 3 (three) times daily as needed for anxiety.   amoxicillin-clavulanate 875-125 MG tablet Commonly known as:   AUGMENTIN Take 1 tablet by mouth 2 (two) times daily.   aspirin EC 81 MG tablet Take 81 mg by mouth daily.   esomeprazole 40 MG capsule Commonly known as:  NEXIUM Take 40 mg by mouth daily before breakfast.   FISH OIL TRIPLE STRENGTH 1400 MG Caps Take 1,400 mg by mouth 3 (three) times daily.   HYDROcodone-acetaminophen 10-325 MG tablet Commonly known as:  NORCO Take 1 tablet by mouth every 6 (six) hours as needed. For pain   Magnesium 250 MG Tabs Take 250 mg by mouth daily.   multivitamin with minerals Tabs tablet Take 1 tablet by mouth daily.   oxyCODONE 10 mg 12 hr tablet Commonly known as:  OXYCONTIN Take 5 mg by mouth every 4 (four) hours as needed. pain   predniSONE 10 MG tablet Commonly known as:  DELTASONE Take 10 mg by mouth daily.   pyridOXINE 100 MG tablet Commonly known as:  VITAMIN B-6 Take 100 mg by mouth daily.   vitamin B-12 1000 MCG tablet Commonly known as:  CYANOCOBALAMIN Take 1,000 mcg by mouth daily.   vitamin C 500 MG tablet Commonly known as:  ASCORBIC ACID Take 500 mg by mouth daily.      Follow-up Information    Dalia HeadingJENKINS,Juanluis Guastella A, MD. Schedule an appointment as soon as possible for a visit on 12/03/2016.   Specialty:  General Surgery Contact information: [address] [place] [id number] [phone]           Signed: Franky MachoJENKINS,Kelci Petrella A 11/29/2016, 10:02 AM

## 2016-12-02 LAB — AEROBIC/ANAEROBIC CULTURE W GRAM STAIN (SURGICAL/DEEP WOUND): Culture: NORMAL

## 2016-12-02 LAB — AEROBIC/ANAEROBIC CULTURE (SURGICAL/DEEP WOUND)

## 2017-02-23 DIAGNOSIS — J329 Chronic sinusitis, unspecified: Secondary | ICD-10-CM | POA: Diagnosis not present

## 2017-02-23 DIAGNOSIS — Z1389 Encounter for screening for other disorder: Secondary | ICD-10-CM | POA: Diagnosis not present

## 2017-02-23 DIAGNOSIS — L720 Epidermal cyst: Secondary | ICD-10-CM | POA: Diagnosis not present

## 2017-02-23 DIAGNOSIS — G894 Chronic pain syndrome: Secondary | ICD-10-CM | POA: Diagnosis not present

## 2017-02-23 DIAGNOSIS — E271 Primary adrenocortical insufficiency: Secondary | ICD-10-CM | POA: Diagnosis not present

## 2017-05-04 DIAGNOSIS — Z1389 Encounter for screening for other disorder: Secondary | ICD-10-CM | POA: Diagnosis not present

## 2017-05-04 DIAGNOSIS — G894 Chronic pain syndrome: Secondary | ICD-10-CM | POA: Diagnosis not present

## 2017-05-07 DIAGNOSIS — Z125 Encounter for screening for malignant neoplasm of prostate: Secondary | ICD-10-CM | POA: Diagnosis not present

## 2017-05-07 DIAGNOSIS — Z1389 Encounter for screening for other disorder: Secondary | ICD-10-CM | POA: Diagnosis not present

## 2017-06-03 DIAGNOSIS — R102 Pelvic and perineal pain: Secondary | ICD-10-CM | POA: Diagnosis not present

## 2017-06-03 DIAGNOSIS — L739 Follicular disorder, unspecified: Secondary | ICD-10-CM | POA: Diagnosis not present

## 2017-06-03 DIAGNOSIS — Z1389 Encounter for screening for other disorder: Secondary | ICD-10-CM | POA: Diagnosis not present

## 2017-06-07 DIAGNOSIS — R102 Pelvic and perineal pain: Secondary | ICD-10-CM | POA: Diagnosis not present

## 2017-06-07 DIAGNOSIS — L739 Follicular disorder, unspecified: Secondary | ICD-10-CM | POA: Diagnosis not present

## 2017-06-07 DIAGNOSIS — Z1389 Encounter for screening for other disorder: Secondary | ICD-10-CM | POA: Diagnosis not present

## 2017-07-08 ENCOUNTER — Ambulatory Visit: Payer: Self-pay | Admitting: General Surgery

## 2017-11-03 DIAGNOSIS — Z23 Encounter for immunization: Secondary | ICD-10-CM | POA: Diagnosis not present

## 2017-11-04 DIAGNOSIS — M1991 Primary osteoarthritis, unspecified site: Secondary | ICD-10-CM | POA: Diagnosis not present

## 2017-11-04 DIAGNOSIS — Z1389 Encounter for screening for other disorder: Secondary | ICD-10-CM | POA: Diagnosis not present

## 2017-11-04 DIAGNOSIS — G4700 Insomnia, unspecified: Secondary | ICD-10-CM | POA: Diagnosis not present

## 2017-11-04 DIAGNOSIS — M5126 Other intervertebral disc displacement, lumbar region: Secondary | ICD-10-CM | POA: Diagnosis not present

## 2018-01-31 DIAGNOSIS — M5116 Intervertebral disc disorders with radiculopathy, lumbar region: Secondary | ICD-10-CM | POA: Insufficient documentation

## 2018-03-03 DIAGNOSIS — Z1211 Encounter for screening for malignant neoplasm of colon: Secondary | ICD-10-CM | POA: Diagnosis not present

## 2018-03-08 DIAGNOSIS — E782 Mixed hyperlipidemia: Secondary | ICD-10-CM | POA: Diagnosis not present

## 2018-03-08 DIAGNOSIS — K219 Gastro-esophageal reflux disease without esophagitis: Secondary | ICD-10-CM | POA: Diagnosis not present

## 2018-03-08 DIAGNOSIS — Z0001 Encounter for general adult medical examination with abnormal findings: Secondary | ICD-10-CM | POA: Diagnosis not present

## 2018-03-08 DIAGNOSIS — I10 Essential (primary) hypertension: Secondary | ICD-10-CM | POA: Diagnosis not present

## 2018-05-13 DIAGNOSIS — M1991 Primary osteoarthritis, unspecified site: Secondary | ICD-10-CM | POA: Diagnosis not present

## 2018-05-13 DIAGNOSIS — E271 Primary adrenocortical insufficiency: Secondary | ICD-10-CM | POA: Diagnosis not present

## 2018-05-13 DIAGNOSIS — R0602 Shortness of breath: Secondary | ICD-10-CM | POA: Diagnosis not present

## 2018-05-13 DIAGNOSIS — R079 Chest pain, unspecified: Secondary | ICD-10-CM | POA: Diagnosis not present

## 2018-05-13 DIAGNOSIS — R1013 Epigastric pain: Secondary | ICD-10-CM | POA: Diagnosis not present

## 2018-05-13 DIAGNOSIS — R109 Unspecified abdominal pain: Secondary | ICD-10-CM | POA: Diagnosis not present

## 2018-05-13 DIAGNOSIS — Z1389 Encounter for screening for other disorder: Secondary | ICD-10-CM | POA: Diagnosis not present

## 2018-06-08 ENCOUNTER — Ambulatory Visit (HOSPITAL_COMMUNITY)
Admission: RE | Admit: 2018-06-08 | Discharge: 2018-06-08 | Disposition: A | Discharge status: Home / Self Care | Payer: Commercial Managed Care - PPO | Source: Ambulatory Visit | Attending: Cardiovascular Disease | Admitting: Cardiovascular Disease

## 2018-06-08 ENCOUNTER — Ambulatory Visit: Payer: Commercial Managed Care - PPO | Admitting: Cardiovascular Disease

## 2018-06-08 ENCOUNTER — Encounter: Payer: Self-pay | Admitting: Cardiovascular Disease

## 2018-06-08 ENCOUNTER — Telehealth: Payer: Self-pay | Admitting: *Deleted

## 2018-06-08 VITALS — BP 140/70 | HR 101 | Ht 70.0 in | Wt 222.2 lb

## 2018-06-08 DIAGNOSIS — R10817 Generalized abdominal tenderness: Secondary | ICD-10-CM | POA: Diagnosis not present

## 2018-06-08 DIAGNOSIS — R0602 Shortness of breath: Secondary | ICD-10-CM | POA: Diagnosis not present

## 2018-06-08 DIAGNOSIS — K5900 Constipation, unspecified: Secondary | ICD-10-CM

## 2018-06-08 DIAGNOSIS — R109 Unspecified abdominal pain: Secondary | ICD-10-CM | POA: Diagnosis not present

## 2018-06-08 NOTE — Progress Notes (Signed)
CARDIOLOGY CONSULT NOTE  Patient ID: Tony Meadows MRN: [id number] DOB/AGE: Jul 28, 1966 52 y.o.  Admit date: (Not on file) Primary Physician: Elfredia Nevins, MD Referring Physician: Elfredia Nevins, MD  Reason for Consultation: Shortness of breath  HPI: Tony Meadows is a 52 y.o. male who is being seen today for the evaluation of shortness of breath at the request of Elfredia Nevins, MD.  Past medical history includes tobacco abuse.  I reviewed labs performed on 05/13/2018: Blood cells 12.7, hemoglobin 16.3, platelets 175, normal troponin, BUN 14, creatinine 0.85, sodium 144, potassium 4.9, normal liver transaminases, normal d-dimer.  I personally reviewed an ECG performed by his PCP which demonstrates normal sinus rhythm with no ischemic ST segment or T wave abnormalities, nor any arrhythmias.  Upon speaking with him further, he tells me symptoms began after back surgery which occurred on 03/10/2018.  He said he felt a "knot "at the bottom of his sternum.  He was given some antibiotics and another medication with no real relief of symptoms.  He become short of breath when bending over.  He said his neurosurgeon did a chest x-ray which was clear as far as he knows.  He was evaluated by Dr. Marikay Alar with [facility].  Yesterday he walked outside yesterday at a fairly good pace with his granddaughter for about a mile and only had some mild shortness of breath.  He denies any exertional chest pain.  He said he has been struggling with abdominal pain and nausea without vomiting.  He did have at least 1 week of constipation after surgery which required manual removal of stool.  Family history: Brother underwent coronary artery stent placement at age 79.  Social history: He smoked anywhere between a pack to a pack and a half of cigarettes daily for over 40 years.   No Known Allergies  Current Outpatient Medications  Medication Sig Dispense Refill  . ALPRAZolam (XANAX)  0.5 MG tablet Take 0.5 mg by mouth daily.    Marland Kitchen aspirin EC 81 MG tablet Take 162 mg by mouth daily.     Marland Kitchen atorvastatin (LIPITOR) 20 MG tablet Take 20 mg by mouth daily.    Vedia Coffer Pepper-Turmeric (TURMERIC CURCUMIN) 04-999 MG CAPS Take 1 capsule by mouth daily.    . bumetanide (BUMEX) 1 MG tablet Take 1 mg by mouth daily.    . Coenzyme Q10 (COQ-10) 100 MG CAPS Take 1 capsule by mouth daily.    Marland Kitchen esomeprazole (NEXIUM) 40 MG capsule Take 40 mg by mouth daily before breakfast.    . gabapentin (NEURONTIN) 100 MG capsule Take 1 capsule by mouth daily as needed.    . Multiple Vitamin (MULTIVITAMIN WITH MINERALS) TABS Take 1 tablet by mouth daily.    Marland Kitchen oxyCODONE (OXYCONTIN) 10 mg 12 hr tablet Take 10 mg by mouth daily. pain    . pantoprazole (PROTONIX) 40 MG tablet Take 40 mg by mouth daily.    . predniSONE (DELTASONE) 10 MG tablet Take 10 mg by mouth daily.    . traZODone (DESYREL) 100 MG tablet Take 100 mg by mouth at bedtime.     No current facility-administered medications for this visit.     Past Medical History:  Diagnosis Date  . Arthritis    Rheumatoid  . Bulging disc   . Complication of anesthesia    pt states that he had a lot of back pain after spinal with hernia repair  . GERD (gastroesophageal reflux disease)   .  Hypertension   . Shortness of breath   . Sleep apnea    STOP BANG score = 4    Past Surgical History:  Procedure Laterality Date  . CHOLECYSTECTOMY  10/12/2012   Procedure: LAPAROSCOPIC CHOLECYSTECTOMY;  Surgeon: Dalia Heading, MD;  Location: [facility];  Service: General;  Laterality: N/A;  . ESOPHAGOGASTRODUODENOSCOPY  10/04/2012   Jenkins:gastritis, moderate  . HERNIA REPAIR    . Left foot repair     due to chain saw accident  . TONSILLECTOMY      Social History   Socioeconomic History  . Marital status: Married    Spouse name: Not on file  . Number of children: Not on file  . Years of education: Not on file  . Highest education level: Not on file    Occupational History  . Not on file  Social Needs  . Financial resource strain: Not on file  . Food insecurity:    Worry: Not on file    Inability: Not on file  . Transportation needs:    Medical: Not on file    Non-medical: Not on file  Tobacco Use  . Smoking status: Current Every Day Smoker    Packs/day: 2.00    Years: 35.00    Pack years: 70.00    Types: Cigarettes  . Smokeless tobacco: Never Used  Substance and Sexual Activity  . Alcohol use: No  . Drug use: No  . Sexual activity: Not on file  Lifestyle  . Physical activity:    Days per week: Not on file    Minutes per session: Not on file  . Stress: Not on file  Relationships  . Social connections:    Talks on phone: Not on file    Gets together: Not on file    Attends religious service: Not on file    Active member of club or organization: Not on file    Attends meetings of clubs or organizations: Not on file    Relationship status: Not on file  . Intimate partner violence:    Fear of current or ex partner: Not on file    Emotionally abused: Not on file    Physically abused: Not on file    Forced sexual activity: Not on file  Other Topics Concern  . Not on file  Social History Narrative  . Not on file      Current Meds  Medication Sig  . ALPRAZolam (XANAX) 0.5 MG tablet Take 0.5 mg by mouth daily.  Marland Kitchen aspirin EC 81 MG tablet Take 162 mg by mouth daily.   Marland Kitchen atorvastatin (LIPITOR) 20 MG tablet Take 20 mg by mouth daily.  Vedia Coffer Pepper-Turmeric (TURMERIC CURCUMIN) 04-999 MG CAPS Take 1 capsule by mouth daily.  . bumetanide (BUMEX) 1 MG tablet Take 1 mg by mouth daily.  . Coenzyme Q10 (COQ-10) 100 MG CAPS Take 1 capsule by mouth daily.  Marland Kitchen esomeprazole (NEXIUM) 40 MG capsule Take 40 mg by mouth daily before breakfast.  . gabapentin (NEURONTIN) 100 MG capsule Take 1 capsule by mouth daily as needed.  . Multiple Vitamin (MULTIVITAMIN WITH MINERALS) TABS Take 1 tablet by mouth daily.  Marland Kitchen oxyCODONE (OXYCONTIN)  10 mg 12 hr tablet Take 10 mg by mouth daily. pain  . pantoprazole (PROTONIX) 40 MG tablet Take 40 mg by mouth daily.  . predniSONE (DELTASONE) 10 MG tablet Take 10 mg by mouth daily.  . traZODone (DESYREL) 100 MG tablet Take 100 mg by mouth at bedtime.  Review of systems complete and found to be negative unless listed above in HPI    Physical exam Blood pressure 140/70, pulse (!) 101, height 5\' 10"  (1.778 m), weight 222 lb 3.2 oz (100.8 kg), SpO2 93 %. General: NAD Neck: No JVD, no thyromegaly or thyroid nodule.  Lungs: Clear to auscultation bilaterally with normal respiratory effort. CV: Nondisplaced PMI. Regular rate and rhythm, normal S1/S2, no S3/S4, no murmur.  No peripheral edema.  No carotid bruit.  Abdomen: Soft, mild epigastric tenderness without rebound/guarding/rigidity, no distention.  Skin: Intact without lesions or rashes.  Neurologic: Alert and oriented x 3.  Psych: Normal affect. Extremities: No clubbing or cyanosis.  HEENT: Normal.   ECG: Most recent ECG reviewed.   Labs: Lab Results  Component Value Date/Time   K 3.5 11/28/2016 05:50 AM   BUN 11 11/28/2016 05:50 AM   CREATININE 0.85 11/28/2016 05:50 AM   ALT 17 11/01/2013 08:30 AM   TSH 0.869 11/01/2013 08:30 AM   HGB 14.4 11/29/2016 05:48 AM     Lipids: No results found for: LDLCALC, LDLDIRECT, CHOL, TRIG, HDL      ASSESSMENT AND PLAN:  1.  Shortness of breath: ECG is normal.  Risk factors include tobacco abuse.  Symptoms are very atypical for ischemic heart disease.  Symptoms primarily occur with bending over.  2.  Abdominal pain and tenderness with constipation: He struggled with constipation and had to manually remove stool.  He does have some epigastric tenderness.  I will obtain abdominal x-rays.  If this is unrevealing, he may require ultrasonography.      Disposition: Follow up to be determined   Signed: Prentice DockerSuresh Somara Frymire, M.D., F.A.C.C.  06/08/2018, 1:35 PM

## 2018-06-08 NOTE — Patient Instructions (Addendum)
Medication Instructions:   Your physician recommends that you continue on your current medications as directed. Please refer to the Current Medication list given to you today.  Labwork:  NONE  Testing/Procedures:  Your physician recommends that you have an abdominal x-ray. A abdominal x-ray takes a picture of the organs and structures inside the abdomen, including the stomach, liver, pancreas, colon and blood vessels.   Follow-Up:  Your physician recommends that you schedule a follow-up appointment in:   Any Other Special Instructions Will Be Listed Below (If Applicable).  If you need a refill on your cardiac medications before your next appointment, please call your pharmacy.

## 2018-06-08 NOTE — Telephone Encounter (Signed)
-----   Message from Laqueta LindenSuresh A Koneswaran, MD sent at 06/08/2018  4:01 PM EDT ----- Moderate amount of stool in colon which could be causing some of his discomfort. Follow up with PCP. Send a copy to Dr. Sherwood GamblerFusco.

## 2018-06-13 ENCOUNTER — Encounter: Payer: Self-pay | Admitting: Cardiovascular Disease

## 2018-06-15 NOTE — Telephone Encounter (Signed)
Patient informed via email today by Abelino DerrickLisa McGhee.

## 2018-12-06 DIAGNOSIS — R0789 Other chest pain: Secondary | ICD-10-CM | POA: Diagnosis not present

## 2018-12-06 DIAGNOSIS — M1991 Primary osteoarthritis, unspecified site: Secondary | ICD-10-CM | POA: Diagnosis not present

## 2018-12-06 DIAGNOSIS — Z1389 Encounter for screening for other disorder: Secondary | ICD-10-CM | POA: Diagnosis not present

## 2018-12-06 DIAGNOSIS — E6609 Other obesity due to excess calories: Secondary | ICD-10-CM | POA: Diagnosis not present

## 2018-12-06 DIAGNOSIS — M2550 Pain in unspecified joint: Secondary | ICD-10-CM | POA: Diagnosis not present

## 2018-12-06 DIAGNOSIS — Z719 Counseling, unspecified: Secondary | ICD-10-CM | POA: Diagnosis not present

## 2018-12-06 DIAGNOSIS — G894 Chronic pain syndrome: Secondary | ICD-10-CM | POA: Diagnosis not present

## 2018-12-06 DIAGNOSIS — I10 Essential (primary) hypertension: Secondary | ICD-10-CM | POA: Diagnosis not present

## 2019-01-25 NOTE — Progress Notes (Signed)
Office Visit Note  Patient: Tony Meadows             Date of Birth: 02-16-1966           MRN: [id number]             PCP: Elfredia Nevins, MD Referring: Elfredia Nevins, MD Visit Date: 02/08/2019 Occupation: Disability, heavy Furniture conservator/restorer  Subjective:  Pain in multiple joints   History of Present Illness: Tony Meadows is a 53 y.o. male seen in consultation per request of his PCP.  According to patient his symptoms with joint pain started about 11 years ago with pain in his shoulders, elbows and his knee joints.  He states at the time his lab work was positive for rheumatoid arthritis and he was treated with the Advil and hydrocodone.  He states over time he had elevated creatinine and he was switched to oxycodone.  He states he had frequent infections and did not want to see a rheumatologist.  He states he has been in a lot of pain despite being on oxycodone.  He developed a back injury and underwent lumbar spine surgery for discectomy by Dr. Yetta Barre in March 2019.  He states after that he has been experiencing increased severe pain.  He describes pain in his bilateral hip joints, knee joints, ankles and feet.  He also has pain and discomfort in his hands which he describes over [facility] bilateral elbows and shoulders.  He has chronic neck and lower back pain.  He states he has generalized burning sensation and pain going on for last 8 years.  He had seen a neurologist in the past who thought he had fibromyalgia.  He states he had extensive labs which were normal.  He has been also experiencing panic attacks due to severe pain.  He states he uses Xanax for that.  He denies any history of joint swelling.  There is no family history of autoimmune disease.  He states he was diagnosed with adrenal insufficiency few years back and has been on prednisone.  Activities of Daily Living:  Patient reports morning stiffness for 2 hours.   Patient Reports nocturnal pain.  Difficulty dressing/grooming:  Denies Difficulty climbing stairs: Reports Difficulty getting out of chair: Reports Difficulty using hands for taps, buttons, cutlery, and/or writing: Reports  Review of Systems  Constitutional: Positive for fatigue. Negative for night sweats.  HENT: Positive for mouth dryness. Negative for mouth sores and nose dryness.   Eyes: Positive for dryness. Negative for redness.  Respiratory: Negative for shortness of breath and difficulty breathing.   Cardiovascular: Negative for chest pain, palpitations, hypertension, irregular heartbeat and swelling in legs/feet.  Gastrointestinal: Positive for constipation. Negative for diarrhea.  Endocrine: Negative for excessive thirst and increased urination.  Genitourinary: Negative for difficulty urinating.  Musculoskeletal: Positive for arthralgias, gait problem, joint pain, muscle weakness, morning stiffness and muscle tenderness. Negative for joint swelling, myalgias and myalgias.  Skin: Negative for color change, rash, hair loss, nodules/bumps, skin tightness, ulcers and sensitivity to sunlight.  Allergic/Immunologic: Positive for susceptible to infections.  Neurological: Positive for numbness. Negative for dizziness, fainting, memory loss, night sweats and weakness.  Hematological: Negative for bruising/bleeding tendency and swollen glands.  Psychiatric/Behavioral: Positive for sleep disturbance. Negative for depressed mood. The patient is nervous/anxious.     PMFS History:  Patient Active Problem List   Diagnosis Date Noted  . Tobacco use disorder 02/08/2019  . History of gastroesophageal reflux (GERD) 02/08/2019  . Essential hypertension  02/08/2019  . Peptic ulcer disease 02/08/2019  . Primary adrenocortical insufficiency (HCC) 02/08/2019  . Chronic pain syndrome 02/08/2019  . Abscess of deep perineal space 11/27/2016    Past Medical History:  Diagnosis Date  . Arthritis    Rheumatoid  . Bulging disc   . Complication of anesthesia    pt  states that he had a lot of back pain after spinal with hernia repair  . GERD (gastroesophageal reflux disease)   . Hypertension   . Shortness of breath   . Sleep apnea    STOP BANG score = 4    Family History  Problem Relation Age of Onset  . Cancer Mother   . Diabetes Mother   . Cancer Father   . Heart failure Sister   . Heart disease Brother        stent placement  . Liver cancer Brother    Past Surgical History:  Procedure Laterality Date  . CHOLECYSTECTOMY  10/12/2012   Procedure: LAPAROSCOPIC CHOLECYSTECTOMY;  Surgeon: Dalia HeadingMark A Jenkins, MD;  Location: [facility];  Service: General;  Laterality: N/A;  . ESOPHAGOGASTRODUODENOSCOPY  10/04/2012   Jenkins:gastritis, moderate  . HERNIA REPAIR    . Left foot repair     due to chain saw accident  . LUMBAR SPINE SURGERY    . PELVIC ABCESS DRAINAGE    . TONSILLECTOMY     Social History   Social History Narrative  . Not on file    There is no immunization history on file for this patient.   Objective: Vital Signs: BP 132/79 (BP Location: Right Arm, Patient Position: Sitting, Cuff Size: Normal)   Pulse 99   Resp 18   Ht 5' 10.25" (1.784 m)   Wt 219 lb 9.6 oz (99.6 kg)   BMI 31.29 kg/m    Physical Exam Vitals signs and nursing note reviewed.  Constitutional:      Appearance: He is well-developed.  HENT:     Head: Normocephalic and atraumatic.  Eyes:     Conjunctiva/sclera: Conjunctivae normal.     Pupils: Pupils are equal, round, and reactive to light.  Neck:     Musculoskeletal: Normal range of motion and neck supple.  Cardiovascular:     Rate and Rhythm: Normal rate and regular rhythm.     Heart sounds: Normal heart sounds.  Pulmonary:     Effort: Pulmonary effort is normal.     Breath sounds: Normal breath sounds.  Abdominal:     General: Bowel sounds are normal.     Palpations: Abdomen is soft.  Skin:    General: Skin is warm and dry.     Capillary Refill: Capillary refill takes less than 2 seconds.    Neurological:     Mental Status: He is alert and oriented to person, place, and time.  Psychiatric:        Behavior: Behavior normal.      Musculoskeletal Exam: C-spine good range of motion.  He has limited painful range of motion of his lumbar spine.  Shoulder joints elbow joints wrist joints with good range of motion.  He has tenderness across his wrist joints PIPs DIPs and CMC joints.  No synovitis was noted.  He had painful range of motion of bilateral hip joints.  He had discomfort range of motion of bilateral knee joints.  No warmth swelling or effusion was noted.  He had tenderness on palpation of bilateral ankle joints MTPs and PIPs.  No warmth swelling or effusion was  noted.  CDAI Exam: CDAI Score: Not documented Patient Global Assessment: Not documented; Provider Global Assessment: Not documented Swollen: Not documented; Tender: Not documented Joint Exam   Not documented   There is currently no information documented on the homunculus. Go to the Rheumatology activity and complete the homunculus joint exam.  Investigation: No additional findings.  Imaging: Xr Hips Bilat W Or W/o Pelvis 2v  Result Date: 02/08/2019 No SI joint narrowing was noted.  No hip joint narrowing was noted.  No chondrocalcinosis was noted. Impression: Unremarkable x-ray of the hip joints.  Xr Foot 2 Views Left  Result Date: 02/08/2019 No MTP PIP or DIP narrowing was noted.  No intertarsal joint space narrowing was noted.  Postsurgical changes noted on the first MTP joint. Impression: Unremarkable x-ray of the foot.  Xr Foot 2 Views Right  Result Date: 02/08/2019 No MTP, PIP or DIP narrowing was noted.  No intertarsal joint space narrowing was noted.  Small calcaneal spur was noted. Unremarkable x-ray of the foot.  Xr Hand 2 View Left  Result Date: 02/08/2019 PIP DIP and CMC narrowing was noted.  No MCP intercarpal or radiocarpal joint space changes were noted.  No erosive changes were noted.  Impression: These findings are consistent with osteoarthritis of the hand.  Xr Hand 2 View Right  Result Date: 02/08/2019 PIP and DIP narrowing was noted.  Second and third MCP joint space narrowing was noted.  Severe narrowing of CMC joint was noted.  No erosive changes were noted.  No metacarpocarpal, intercarpal or radiocarpal joint space narrowing was noted. Impression: These findings are consistent with osteoarthritis of the hand.  Narrowing of the MCP raise the concern about possible inflammatory arthritis.  Xr Knee 3 View Left  Result Date: 02/08/2019 No medial lateral compartment narrowing was noted.  Mild patellofemoral narrowing was noted.  No chondrocalcinosis was noted. Impression: Unremarkable x-ray of the knee joint.  Xr Knee 3 View Right  Result Date: 02/08/2019 No medial lateral compartment narrowing was noted.  Mild patellofemoral narrowing was noted.  No chondrocalcinosis was noted. Impression: Unremarkable x-ray of the knee joint.   Recent Labs: Lab Results  Component Value Date   WBC 15.2 (H) 11/29/2016   HGB 14.4 11/29/2016   PLT 238 11/29/2016   NA 136 11/28/2016   K 3.5 11/28/2016   CL 101 11/28/2016   CO2 27 11/28/2016   GLUCOSE 105 (H) 11/28/2016   BUN 11 11/28/2016   CREATININE 0.85 11/28/2016   BILITOT 0.3 11/01/2013   ALKPHOS 91 11/01/2013   AST 15 11/01/2013   ALT 17 11/01/2013   PROT 7.0 11/01/2013   ALBUMIN 3.8 11/01/2013   CALCIUM 8.8 (L) 11/28/2016   GFRAA >60 11/28/2016    Speciality Comments: No specialty comments available.  Procedures:  No procedures performed Allergies: Patient has no known allergies.   Assessment / Plan:     Visit Diagnoses: Polyarthralgia - 05/13/18: CK 42  Pain in both hands -patient complains of pain and discomfort in his bilateral hands.  He had no synovitis on examination.  The clinical and radiographic findings are consistent with osteoarthritis.  I will obtain following labs to look for any underlying  inflammatory process.  Plan: XR Hand 2 View Right, XR Hand 2 View Left, Sedimentation rate, Rheumatoid factor, Cyclic citrul peptide antibody, IgG, Uric acid, ANA  Chronic pain of both hips -he complains of pain in bilateral hip joints.  He had painful range of motion of his hip joints.  X-rays were  unremarkable today.  Plan: XR HIPS BILAT W OR W/O PELVIS 2V  Chronic pain of both knees -he complains of discomfort in his bilateral knee joints.  There was no warmth swelling or effusion on examination.  X-rays were unremarkable.  Plan: XR KNEE 3 VIEW RIGHT, XR KNEE 3 VIEW LEFT  Pain in both feet -he complains of discomfort in his bilateral feet.  No warmth or swelling was noted.  X-rays were consistent but unremarkable except for some postoperative change in the left first MTP joint.  Plan: XR Foot 2 Views Right, XR Foot 2 Views Left  Chronic pain syndrome-patient has been on oxycodone for many years.  DDD (degenerative disc disease), lumbar - s/p discectomy 03/12/18 by Dr. Yetta BarreJones.  He continues to have discomfort in his lower back despite having surgery.  Primary adrenocortical insufficiency (HCC)-he reports being on prednisone for many years.  Peptic ulcer disease  History of gastroesophageal reflux (GERD)  Essential hypertension-his blood pressure was mildly elevated today.  Dyslipidemia  Abscess of deep perineal space  Tobacco use disorder  Other fatigue - Plan: CBC with Differential/Platelet, COMPLETE METABOLIC PANEL WITH GFR   Orders: Orders Placed This Encounter  Procedures  . XR Hand 2 View Right  . XR Hand 2 View Left  . XR KNEE 3 VIEW RIGHT  . XR KNEE 3 VIEW LEFT  . XR HIPS BILAT W OR W/O PELVIS 2V  . XR Foot 2 Views Right  . XR Foot 2 Views Left  . CBC with Differential/Platelet  . COMPLETE METABOLIC PANEL WITH GFR  . Sedimentation rate  . Rheumatoid factor  . Cyclic citrul peptide antibody, IgG  . Uric acid  . ANA   No orders of the defined types were placed in  this encounter.   Face-to-face time spent with patient was 50 minutes. Greater than 50% of time was spent in counseling and coordination of care.  Follow-Up Instructions: Return for Osteoarthritis.   Pollyann SavoyShaili TRUE Garciamartinez, MD  Note - This record has been created using Animal nutritionistDragon software.  Chart creation errors have been sought, but may not always  have been located. Such creation errors do not reflect on  the standard of medical care.

## 2019-01-27 ENCOUNTER — Ambulatory Visit: Payer: Commercial Managed Care - PPO | Admitting: Cardiovascular Disease

## 2019-02-08 ENCOUNTER — Ambulatory Visit (INDEPENDENT_AMBULATORY_CARE_PROVIDER_SITE_OTHER): Payer: Self-pay

## 2019-02-08 ENCOUNTER — Ambulatory Visit (INDEPENDENT_AMBULATORY_CARE_PROVIDER_SITE_OTHER): Payer: Medicaid Other

## 2019-02-08 ENCOUNTER — Ambulatory Visit: Payer: Commercial Managed Care - PPO | Admitting: Rheumatology

## 2019-02-08 ENCOUNTER — Encounter: Payer: Self-pay | Admitting: Rheumatology

## 2019-02-08 VITALS — BP 132/79 | HR 99 | Resp 18 | Ht 70.25 in | Wt 219.6 lb

## 2019-02-08 DIAGNOSIS — M25551 Pain in right hip: Secondary | ICD-10-CM

## 2019-02-08 DIAGNOSIS — M25552 Pain in left hip: Secondary | ICD-10-CM

## 2019-02-08 DIAGNOSIS — G8929 Other chronic pain: Secondary | ICD-10-CM

## 2019-02-08 DIAGNOSIS — N340 Urethral abscess: Secondary | ICD-10-CM

## 2019-02-08 DIAGNOSIS — E271 Primary adrenocortical insufficiency: Secondary | ICD-10-CM | POA: Insufficient documentation

## 2019-02-08 DIAGNOSIS — M79641 Pain in right hand: Secondary | ICD-10-CM | POA: Diagnosis not present

## 2019-02-08 DIAGNOSIS — M25561 Pain in right knee: Secondary | ICD-10-CM | POA: Diagnosis not present

## 2019-02-08 DIAGNOSIS — F17200 Nicotine dependence, unspecified, uncomplicated: Secondary | ICD-10-CM | POA: Insufficient documentation

## 2019-02-08 DIAGNOSIS — R5383 Other fatigue: Secondary | ICD-10-CM

## 2019-02-08 DIAGNOSIS — M5136 Other intervertebral disc degeneration, lumbar region: Secondary | ICD-10-CM

## 2019-02-08 DIAGNOSIS — M79642 Pain in left hand: Secondary | ICD-10-CM | POA: Diagnosis not present

## 2019-02-08 DIAGNOSIS — M79672 Pain in left foot: Secondary | ICD-10-CM

## 2019-02-08 DIAGNOSIS — K279 Peptic ulcer, site unspecified, unspecified as acute or chronic, without hemorrhage or perforation: Secondary | ICD-10-CM | POA: Insufficient documentation

## 2019-02-08 DIAGNOSIS — M79671 Pain in right foot: Secondary | ICD-10-CM

## 2019-02-08 DIAGNOSIS — E785 Hyperlipidemia, unspecified: Secondary | ICD-10-CM

## 2019-02-08 DIAGNOSIS — M25562 Pain in left knee: Secondary | ICD-10-CM

## 2019-02-08 DIAGNOSIS — M2550 Pain in unspecified joint: Secondary | ICD-10-CM

## 2019-02-08 DIAGNOSIS — Z8719 Personal history of other diseases of the digestive system: Secondary | ICD-10-CM | POA: Insufficient documentation

## 2019-02-08 DIAGNOSIS — M51369 Other intervertebral disc degeneration, lumbar region without mention of lumbar back pain or lower extremity pain: Secondary | ICD-10-CM

## 2019-02-08 DIAGNOSIS — G894 Chronic pain syndrome: Secondary | ICD-10-CM | POA: Insufficient documentation

## 2019-02-08 DIAGNOSIS — I10 Essential (primary) hypertension: Secondary | ICD-10-CM | POA: Insufficient documentation

## 2019-02-10 LAB — COMPLETE METABOLIC PANEL WITH GFR
AG Ratio: 2 (calc) (ref 1.0–2.5)
ALT: 18 U/L (ref 9–46)
AST: 14 U/L (ref 10–35)
Albumin: 4.3 g/dL (ref 3.6–5.1)
Alkaline phosphatase (APISO): 107 U/L (ref 35–144)
BUN: 19 mg/dL (ref 7–25)
CALCIUM: 9.6 mg/dL (ref 8.6–10.3)
CO2: 30 mmol/L (ref 20–32)
CREATININE: 0.91 mg/dL (ref 0.70–1.33)
Chloride: 106 mmol/L (ref 98–110)
GFR, EST NON AFRICAN AMERICAN: 95 mL/min/{1.73_m2} (ref >=60)
GFR, Est African American: 110 mL/min/{1.73_m2} (ref >=60)
GLOBULIN: 2.1 g/dL (ref 1.9–3.7)
Glucose, Bld: 96 mg/dL (ref 65–99)
Potassium: 4.6 mmol/L (ref 3.5–5.3)
SODIUM: 143 mmol/L (ref 135–146)
TOTAL PROTEIN: 6.4 g/dL (ref 6.1–8.1)
Total Bilirubin: 0.3 mg/dL (ref 0.2–1.2)

## 2019-02-10 LAB — CBC WITH DIFFERENTIAL/PLATELET
ABSOLUTE MONOCYTES: 848 {cells}/uL (ref 200–950)
BASOS PCT: 0.4 %
Basophils Absolute: 56 cells/uL (ref 0–200)
Eosinophils Absolute: 111 cells/uL (ref 15–500)
Eosinophils Relative: 0.8 %
HCT: 47.9 % (ref 38.5–50.0)
HEMOGLOBIN: 16.4 g/dL (ref 13.2–17.1)
Lymphs Abs: 3044 cells/uL (ref 850–3900)
MCH: 31.3 pg (ref 27.0–33.0)
MCHC: 34.2 g/dL (ref 32.0–36.0)
MCV: 91.4 fL (ref 80.0–100.0)
MONOS PCT: 6.1 %
MPV: 11.9 fL (ref 7.5–12.5)
NEUTROS ABS: 9841 {cells}/uL — AB (ref 1500–7800)
Neutrophils Relative %: 70.8 %
Platelets: 200 10*3/uL (ref 140–400)
RBC: 5.24 10*6/uL (ref 4.20–5.80)
RDW: 12.8 % (ref 11.0–15.0)
Total Lymphocyte: 21.9 %
WBC: 13.9 10*3/uL — ABNORMAL HIGH (ref 3.8–10.8)

## 2019-02-10 LAB — SEDIMENTATION RATE: Sed Rate: 2 mm/h (ref 0–20)

## 2019-02-10 LAB — RHEUMATOID FACTOR: Rheumatoid fact SerPl-aCnc: <14 IU/mL (ref <14)

## 2019-02-10 LAB — ANA: Anti Nuclear Antibody(ANA): NEGATIVE

## 2019-02-10 LAB — URIC ACID: Uric Acid, Serum: 5.6 mg/dL (ref 4.0–8.0)

## 2019-02-10 LAB — CYCLIC CITRUL PEPTIDE ANTIBODY, IGG

## 2019-02-24 NOTE — Progress Notes (Signed)
Office Visit Note  Patient: Tony Meadows             Date of Birth: Jun 22, 1966           MRN: [id number]             PCP: Redmond School, MD Referring: Redmond School, MD Visit Date: 03/09/2019 Occupation: _0 @  Subjective:  (patient complains of constant "burning" )   History of Present Illness: Tony Meadows is a 53 y.o. male with history of degenerative disc disease of lumbar spine.  He states the pain starts in his cervical spine and goes all the way down to his tailbone.  Hands continue to hurt.  He states the pain has been severe.  He has some discomfort in his hands especially over the [facility] joints.  None of the other joints are painful currently.  Continues to have some discomfort in his knee joints.  Patient reports that he has intense burning in his extremities which is severe.  He has tried gabapentin in the past which was not effective.  He states Xanax is the only medication which will help him up for short duration.  He wants a referral to neurology.  Activities of Daily Living:  Patient reports morning stiffness for 2-3 hours.   Patient Reports nocturnal pain.  Difficulty dressing/grooming: Denies Difficulty climbing stairs: Reports Difficulty getting out of chair: Reports Difficulty using hands for taps, buttons, cutlery, and/or writing: Reports  Review of Systems  Constitutional: Positive for fatigue. Negative for night sweats.  HENT: Positive for mouth dryness and nose dryness. Negative for mouth sores.   Eyes: Positive for dryness. Negative for pain, redness and itching.  Respiratory: Negative for shortness of breath, wheezing and difficulty breathing.   Cardiovascular: Negative for chest pain, palpitations, hypertension, irregular heartbeat and swelling in legs/feet.  Gastrointestinal: Negative for abdominal pain, constipation and diarrhea.  Endocrine: Negative for increased urination.  Genitourinary: Negative for painful urination and pelvic pain.    Musculoskeletal: Positive for arthralgias, joint pain, morning stiffness and muscle tenderness. Negative for joint swelling, myalgias, muscle weakness and myalgias.  Skin: Positive for rash. Negative for color change, hair loss, nodules/bumps, skin tightness, ulcers and sensitivity to sunlight.  Allergic/Immunologic: Negative for susceptible to infections.  Neurological: Positive for parasthesias and memory loss. Negative for dizziness, fainting, headaches, night sweats and weakness.  Hematological: Negative for swollen glands.  Psychiatric/Behavioral: Negative for depressed mood, confusion and sleep disturbance. The patient is not nervous/anxious.     PMFS History:  Patient Active Problem List   Diagnosis Date Noted  . DDD (degenerative disc disease), lumbar 02/27/2019  . Primary osteoarthritis of both hands 02/27/2019  . Dyslipidemia 02/27/2019  . Tobacco use disorder 02/08/2019  . History of gastroesophageal reflux (GERD) 02/08/2019  . Essential hypertension 02/08/2019  . Peptic ulcer disease 02/08/2019  . Primary adrenocortical insufficiency ([place]) 02/08/2019  . Chronic pain syndrome 02/08/2019  . Abscess of deep perineal space 11/27/2016    Past Medical History:  Diagnosis Date  . Arthritis    Rheumatoid  . Bulging disc   . Complication of anesthesia    pt states that he had a lot of back pain after spinal with hernia repair  . GERD (gastroesophageal reflux disease)   . Hypertension   . Shortness of breath   . Sleep apnea    STOP BANG score = 4    Family History  Problem Relation Age of Onset  . Cancer Mother   . Diabetes Mother   .  Cancer Father   . Heart failure Sister   . Heart disease Brother        stent placement  . Liver cancer Brother    Past Surgical History:  Procedure Laterality Date  . CHOLECYSTECTOMY  10/12/2012   Procedure: LAPAROSCOPIC CHOLECYSTECTOMY;  Surgeon: Jamesetta So, MD;  Location: [facility];  Service: General;  Laterality: N/A;  .  ESOPHAGOGASTRODUODENOSCOPY  10/04/2012   Jenkins:gastritis, moderate  . HERNIA REPAIR    . Left foot repair     due to chain saw accident  . LUMBAR SPINE SURGERY    . PELVIC ABCESS DRAINAGE    . TONSILLECTOMY     Social History   Social History Narrative  . Not on file    There is no immunization history on file for this patient.   Objective: Vital Signs: BP (!) 144/76 (BP Location: Left Arm, Patient Position: Sitting, Cuff Size: Normal)   Pulse 85   Resp 15   Ht 5' 10.25" (1.784 m)   Wt 215 lb 3.2 oz (97.6 kg)   BMI 30.66 kg/m    Physical Exam Vitals signs and nursing note reviewed.  Constitutional:      Appearance: He is well-developed.  HENT:     Head: Normocephalic and atraumatic.  Eyes:     Conjunctiva/sclera: Conjunctivae normal.     Pupils: Pupils are equal, round, and reactive to light.  Neck:     Musculoskeletal: Normal range of motion and neck supple.  Cardiovascular:     Rate and Rhythm: Normal rate and regular rhythm.     Heart sounds: Normal heart sounds.  Pulmonary:     Effort: Pulmonary effort is normal.     Breath sounds: Normal breath sounds.  Abdominal:     General: Bowel sounds are normal.     Palpations: Abdomen is soft.  Skin:    General: Skin is warm and dry.     Capillary Refill: Capillary refill takes less than 2 seconds.  Neurological:     Mental Status: He is alert and oriented to person, place, and time.  Psychiatric:        Behavior: Behavior normal.      Musculoskeletal Exam: C-spine good range of motion.  He has limited range of motion of his lumbar spine.  He discomfort range of motion of his cervical spine.  He has painful range of motion of his right shoulder joint which was limited.  Left shoulder joint, elbow joints, wrist joints with good range of motion.  He has DIP PIP and CMC thickening consistent with osteoarthritis.  Hip joints knee joints ankles MTPs PIPs been good range of motion with no synovitis.  CDAI Exam: CDAI  Score: Not documented Patient Global Assessment: Not documented; Provider Global Assessment: Not documented Swollen: Not documented; Tender: Not documented Joint Exam   Not documented   There is currently no information documented on the homunculus. Go to the Rheumatology activity and complete the homunculus joint exam.  Investigation: No additional findings.  Imaging: Xr Hips Bilat W Or W/o Pelvis 2v  Result Date: 02/08/2019 No SI joint narrowing was noted.  No hip joint narrowing was noted.  No chondrocalcinosis was noted. Impression: Unremarkable x-ray of the hip joints.  Xr Cervical Spine 2 Or 3 Views  Result Date: 03/09/2019 No significant disc space narrowing was noted.  No facet joint arthropathy was noted. Impression: Unremarkable x-ray of the cervical spine.  Xr Foot 2 Views Left  Result Date: 02/08/2019 No MTP  PIP or DIP narrowing was noted.  No intertarsal joint space narrowing was noted.  Postsurgical changes noted on the first MTP joint. Impression: Unremarkable x-ray of the foot.  Xr Foot 2 Views Right  Result Date: 02/08/2019 No MTP, PIP or DIP narrowing was noted.  No intertarsal joint space narrowing was noted.  Small calcaneal spur was noted. Unremarkable x-ray of the foot.  Xr Hand 2 View Left  Result Date: 02/08/2019 PIP DIP and CMC narrowing was noted.  No MCP intercarpal or radiocarpal joint space changes were noted.  No erosive changes were noted. Impression: These findings are consistent with osteoarthritis of the hand.  Xr Hand 2 View Right  Result Date: 02/08/2019 PIP and DIP narrowing was noted.  Second and third MCP joint space narrowing was noted.  Severe narrowing of CMC joint was noted.  No erosive changes were noted.  No metacarpocarpal, intercarpal or radiocarpal joint space narrowing was noted. Impression: These findings are consistent with osteoarthritis of the hand.  Narrowing of the MCP raise the concern about possible inflammatory  arthritis.  Xr Knee 3 View Left  Result Date: 02/08/2019 No medial lateral compartment narrowing was noted.  Mild patellofemoral narrowing was noted.  No chondrocalcinosis was noted. Impression: Unremarkable x-ray of the knee joint.  Xr Knee 3 View Right  Result Date: 02/08/2019 No medial lateral compartment narrowing was noted.  Mild patellofemoral narrowing was noted.  No chondrocalcinosis was noted. Impression: Unremarkable x-ray of the knee joint.   Recent Labs: Lab Results  Component Value Date   WBC 13.9 (H) 02/08/2019   HGB 16.4 02/08/2019   PLT 200 02/08/2019   NA 143 02/08/2019   K 4.6 02/08/2019   CL 106 02/08/2019   CO2 30 02/08/2019   GLUCOSE 96 02/08/2019   BUN 19 02/08/2019   CREATININE 0.91 02/08/2019   BILITOT 0.3 02/08/2019   ALKPHOS 91 11/01/2013   AST 14 02/08/2019   ALT 18 02/08/2019   PROT 6.4 02/08/2019   ALBUMIN 3.8 11/01/2013   CALCIUM 9.6 02/08/2019   GFRAA 110 02/08/2019  ESR 2, RF negative, anti-CCP negative, uric acid 5.6, ANA negative  Speciality Comments: No specialty comments available.  Procedures:  No procedures performed Allergies: Patient has no known allergies.   Assessment / Plan:     Visit Diagnoses: Primary osteoarthritis of both hands-patient complains of pain in his bilateral hands especially over CMC joints.  I offered CMC brace.  He states he has a [place] brace at home which he will start using.  A handout on hand exercises was given.  Neck pain -he complains of severe pain and discomfort in cervical spine.  Plan: XR Cervical Spine 2 or 3 views.  X-ray of the cervical spine was unremarkable.  Polyarthralgia -he continues to have discomfort in multiple joints.  X-ray of bilateral hip joints, bilateral knee joints and bilateral feet were unremarkable at the last visit.  No synovitis was noted on examination.  All autoimmune work-up has been negative.  DDD (degenerative disc disease), lumbar - s/p discectomy 03/12/18 by Dr. Ronnald Ramp.   He has persistent pain.  Chronic pain syndrome-he complains of chronic pain and discomfort.  Paresthesia of upper and lower extremities of both sides -patient states the most difficult situation for him is the burning sensation he experiences in upper and lower extremities.  Plan: Ambulatory referral to Neurology   Essential hypertension-his blood pressure was elevated today.  He has been advised to monitor blood pressure closely and follow-up with the  PCP.  History of gastroesophageal reflux (GERD)  Primary adrenocortical insufficiency (HCC)  Tobacco use disorder  Dyslipidemia    Orders: Orders Placed This Encounter  Procedures  . XR Cervical Spine 2 or 3 views  . Ambulatory referral to Neurology   No orders of the defined types were placed in this encounter.     Follow-Up Instructions: Return in about 1 year (around 03/08/2020) for Osteoarthritis.   Bo Merino, MD  Note - This record has been created using Editor, commissioning.  Chart creation errors have been sought, but may not always  have been located. Such creation errors do not reflect on  the standard of medical care.

## 2019-02-27 DIAGNOSIS — M19042 Primary osteoarthritis, left hand: Secondary | ICD-10-CM

## 2019-02-27 DIAGNOSIS — M5136 Other intervertebral disc degeneration, lumbar region: Secondary | ICD-10-CM | POA: Insufficient documentation

## 2019-02-27 DIAGNOSIS — E785 Hyperlipidemia, unspecified: Secondary | ICD-10-CM | POA: Insufficient documentation

## 2019-02-27 DIAGNOSIS — M51369 Other intervertebral disc degeneration, lumbar region without mention of lumbar back pain or lower extremity pain: Secondary | ICD-10-CM | POA: Insufficient documentation

## 2019-02-27 DIAGNOSIS — M19041 Primary osteoarthritis, right hand: Secondary | ICD-10-CM | POA: Insufficient documentation

## 2019-03-02 ENCOUNTER — Encounter: Payer: Self-pay | Admitting: Cardiovascular Disease

## 2019-03-02 ENCOUNTER — Other Ambulatory Visit: Payer: Self-pay

## 2019-03-02 ENCOUNTER — Ambulatory Visit (INDEPENDENT_AMBULATORY_CARE_PROVIDER_SITE_OTHER): Payer: Medicaid Other | Admitting: Cardiovascular Disease

## 2019-03-02 VITALS — BP 124/70 | HR 96 | Ht 70.25 in | Wt 216.0 lb

## 2019-03-02 DIAGNOSIS — Z720 Tobacco use: Secondary | ICD-10-CM | POA: Diagnosis not present

## 2019-03-02 DIAGNOSIS — R0609 Other forms of dyspnea: Secondary | ICD-10-CM | POA: Diagnosis not present

## 2019-03-02 DIAGNOSIS — R0600 Dyspnea, unspecified: Secondary | ICD-10-CM

## 2019-03-02 NOTE — Patient Instructions (Signed)
Medication Instructions:  Your physician recommends that you continue on your current medications as directed. Please refer to the Current Medication list given to you today.  If you need a refill on your cardiac medications before your next appointment, please call your pharmacy.   Lab work: None today If you have labs (blood work) drawn today and your tests are completely normal, you will receive your results only by: . MyChart Message (if you have MyChart) OR . A paper copy in the mail If you have any lab test that is abnormal or we need to change your treatment, we will call you to review the results.  Testing/Procedures: Your physician has requested that you have a lexiscan myoview. For further information please visit [email or web address]. Please follow instruction sheet, as given.    Follow-Up: At [facility], you and your health needs are our priority.  As part of our continuing mission to provide you with exceptional heart care, we have created designated Provider Care Teams.  These Care Teams include your primary Cardiologist (physician) and Advanced Practice Providers (APPs -  Physician Assistants and Nurse Practitioners) who all work together to provide you with the care you need, when you need it. You will need a follow up appointment in 3 months.  Please call our office 2 months in advance to schedule this appointment.  You may see Suresh Koneswaran, MD or one of the following Advanced Practice Providers on your designated Care Team:   Brittany Strader, PA-C ([place] [facility]) . Michele Lenze, PA-C ([place] [facility])  Any Other Special Instructions Will Be Listed Below (If Applicable). None   

## 2019-03-02 NOTE — Progress Notes (Signed)
SUBJECTIVE: The patient presents for routine follow-up.  ECG performed in the office today which I ordered and personally interpreted demonstrates normal sinus rhythm with no ischemic ST segment or T-wave abnormalities, nor any arrhythmias.  I last evaluated him in June 2019.  He was recently evaluated by rheumatology for diffuse joint pain.  Rheumatoid factor, cyclic citrulline peptide antibodies and ESR were normal.  He underwent back surgery on 03/10/2018.  Ever since then he has been short of breath with exertion such as doing yard work.  He does light weights at home and has no difficulty doing this.  He has occasional mild chest pains when he become short of breath.  He denies palpitations.  He denies syncope and leg swelling.  He has been smoking 1-1/2 packs of cigarettes daily for the past 40+ years.  He has been taking prednisone for 6 years.  Family history: Brother underwent coronary artery stent placement at the age of 84.    Review of Systems: As per "subjective", otherwise negative.  No Known Allergies  Current Outpatient Medications  Medication Sig Dispense Refill  . ALPRAZolam (XANAX) 0.5 MG tablet Take 0.5 mg by mouth daily. Patient takes 1 MG daily    . aspirin EC 81 MG tablet Take 162 mg by mouth daily.     Marland Kitchen atorvastatin (LIPITOR) 20 MG tablet Take 20 mg by mouth daily.    Renard Hamper Pepper-Turmeric (TURMERIC CURCUMIN) 04-999 MG CAPS Take 1 capsule by mouth daily.    . bumetanide (BUMEX) 1 MG tablet Take 1 mg by mouth daily.    . Coenzyme Q10 (COQ-10) 100 MG CAPS Take 1 capsule by mouth daily.    Marland Kitchen esomeprazole (NEXIUM) 40 MG capsule Take 40 mg by mouth daily before breakfast.    . gabapentin (NEURONTIN) 100 MG capsule Take 1 capsule by mouth daily as needed.    . Multiple Vitamin (MULTIVITAMIN WITH MINERALS) TABS Take 1 tablet by mouth daily.    Marland Kitchen oxyCODONE (OXYCONTIN) 10 mg 12 hr tablet Take 10 mg by mouth daily. pain    . pantoprazole (PROTONIX) 40 MG  tablet Take 40 mg by mouth daily.    . predniSONE (DELTASONE) 10 MG tablet Take 10 mg by mouth daily.    . traZODone (DESYREL) 100 MG tablet Take 100 mg by mouth at bedtime.     No current facility-administered medications for this visit.     Past Medical History:  Diagnosis Date  . Arthritis    Rheumatoid  . Bulging disc   . Complication of anesthesia    pt states that he had a lot of back pain after spinal with hernia repair  . GERD (gastroesophageal reflux disease)   . Hypertension   . Shortness of breath   . Sleep apnea    STOP BANG score = 4    Past Surgical History:  Procedure Laterality Date  . CHOLECYSTECTOMY  10/12/2012   Procedure: LAPAROSCOPIC CHOLECYSTECTOMY;  Surgeon: Jamesetta So, MD;  Location: [facility];  Service: General;  Laterality: N/A;  . ESOPHAGOGASTRODUODENOSCOPY  10/04/2012   Jenkins:gastritis, moderate  . HERNIA REPAIR    . Left foot repair     due to chain saw accident  . LUMBAR SPINE SURGERY    . PELVIC ABCESS DRAINAGE    . TONSILLECTOMY      Social History   Socioeconomic History  . Marital status: Married    Spouse name: Not on file  . Number of children:  Not on file  . Years of education: Not on file  . Highest education level: Not on file  Occupational History  . Not on file  Social Needs  . Financial resource strain: Not on file  . Food insecurity:    Worry: Not on file    Inability: Not on file  . Transportation needs:    Medical: Not on file    Non-medical: Not on file  Tobacco Use  . Smoking status: Current Every Day Smoker    Packs/day: 2.00    Years: 41.00    Pack years: 82.00    Types: Cigarettes  . Smokeless tobacco: Never Used  Substance and Sexual Activity  . Alcohol use: No  . Drug use: No  . Sexual activity: Not on file  Lifestyle  . Physical activity:    Days per week: Not on file    Minutes per session: Not on file  . Stress: Not on file  Relationships  . Social connections:    Talks on phone: Not on  file    Gets together: Not on file    Attends religious service: Not on file    Active member of club or organization: Not on file    Attends meetings of clubs or organizations: Not on file    Relationship status: Not on file  . Intimate partner violence:    Fear of current or ex partner: Not on file    Emotionally abused: Not on file    Physically abused: Not on file    Forced sexual activity: Not on file  Other Topics Concern  . Not on file  Social History Narrative  . Not on file     Vitals:   03/02/19 1338  BP: 124/70  Pulse: 96  SpO2: 96%  Weight: 216 lb (98 kg)  Height: 5' 10.25" (1.784 m)    Wt Readings from Last 3 Encounters:  03/02/19 216 lb (98 kg)  02/08/19 219 lb 9.6 oz (99.6 kg)  06/08/18 222 lb 3.2 oz (100.8 kg)     PHYSICAL EXAM General: NAD HEENT: Normal. Neck: No JVD, no thyromegaly. Lungs: Diffusely diminished breath sounds with no crackles or wheezes.   CV: Regular rate and rhythm, normal S1/S2, no S3/S4, no murmur. No pretibial or periankle edema.  No carotid bruit.   Abdomen: Soft, nontender, no distention.  Neurologic: Alert and oriented.  Psych: Normal affect. Skin: Normal. Musculoskeletal: No gross deformities.    ECG: Reviewed above under Subjective   Labs: Lab Results  Component Value Date/Time   K 4.6 02/08/2019 09:27 AM   BUN 19 02/08/2019 09:27 AM   CREATININE 0.91 02/08/2019 09:27 AM   ALT 18 02/08/2019 09:27 AM   TSH 0.869 11/01/2013 08:30 AM   HGB 16.4 02/08/2019 09:27 AM     Lipids: No results found for: LDLCALC, LDLDIRECT, CHOL, TRIG, HDL     ASSESSMENT AND PLAN: 1.  Dyspnea on exertion: He has a family history of heart disease.  He has a long history of tobacco abuse.  Due to his lower back pain, he does not think he would be able to walk on a treadmill.  I will proceed with a nuclear myocardial perfusion imaging study to evaluate for ischemic heart disease (Lexiscan Myoview).  If stress testing is unremarkable, I  would proceed with pulmonary function testing.  2.  Tobacco abuse: He has been smoking 1.5 packs of cigarettes daily for over 40 years.  Given his exertional dyspnea, he likely needs  pulmonary function testing.     Disposition: Follow up 3 months.   Kate Sable, M.D., F.A.C.C.

## 2019-03-06 ENCOUNTER — Telehealth: Payer: Self-pay | Admitting: Internal Medicine

## 2019-03-06 NOTE — Telephone Encounter (Signed)
LM on VM With corona virus would recomm postponing stress test to later this spring   End of April or may when outbreak is under better control LM that someone would call

## 2019-03-08 ENCOUNTER — Encounter (HOSPITAL_COMMUNITY): Payer: Medicaid Other

## 2019-03-09 ENCOUNTER — Ambulatory Visit (INDEPENDENT_AMBULATORY_CARE_PROVIDER_SITE_OTHER): Payer: Medicaid Other

## 2019-03-09 ENCOUNTER — Ambulatory Visit (INDEPENDENT_AMBULATORY_CARE_PROVIDER_SITE_OTHER): Payer: Medicaid Other | Admitting: Rheumatology

## 2019-03-09 ENCOUNTER — Other Ambulatory Visit: Payer: Self-pay

## 2019-03-09 ENCOUNTER — Encounter: Payer: Self-pay | Admitting: Rheumatology

## 2019-03-09 VITALS — BP 144/76 | HR 85 | Resp 15 | Ht 70.25 in | Wt 215.2 lb

## 2019-03-09 DIAGNOSIS — M5136 Other intervertebral disc degeneration, lumbar region: Secondary | ICD-10-CM | POA: Diagnosis not present

## 2019-03-09 DIAGNOSIS — M19041 Primary osteoarthritis, right hand: Secondary | ICD-10-CM

## 2019-03-09 DIAGNOSIS — Z8719 Personal history of other diseases of the digestive system: Secondary | ICD-10-CM

## 2019-03-09 DIAGNOSIS — M2550 Pain in unspecified joint: Secondary | ICD-10-CM | POA: Diagnosis not present

## 2019-03-09 DIAGNOSIS — E785 Hyperlipidemia, unspecified: Secondary | ICD-10-CM

## 2019-03-09 DIAGNOSIS — F17200 Nicotine dependence, unspecified, uncomplicated: Secondary | ICD-10-CM

## 2019-03-09 DIAGNOSIS — E271 Primary adrenocortical insufficiency: Secondary | ICD-10-CM

## 2019-03-09 DIAGNOSIS — I10 Essential (primary) hypertension: Secondary | ICD-10-CM

## 2019-03-09 DIAGNOSIS — M19042 Primary osteoarthritis, left hand: Secondary | ICD-10-CM

## 2019-03-09 DIAGNOSIS — M542 Cervicalgia: Secondary | ICD-10-CM

## 2019-03-09 DIAGNOSIS — G894 Chronic pain syndrome: Secondary | ICD-10-CM

## 2019-03-09 DIAGNOSIS — R202 Paresthesia of skin: Secondary | ICD-10-CM

## 2019-03-09 NOTE — Patient Instructions (Signed)
Cervical Strain and Sprain Rehab Ask your health care provider which exercises are safe for you. Do exercises exactly as told by your health care provider and adjust them as directed. It is normal to feel mild stretching, pulling, tightness, or discomfort as you do these exercises, but you should stop right away if you feel sudden pain or your pain gets worse.Do not begin these exercises until told by your health care provider. Stretching and range of motion exercises These exercises warm up your muscles and joints and improve the movement and flexibility of your neck. These exercises also help to relieve pain, numbness, and tingling. Exercise A: Cervical side bend  1. Using good posture, sit on a stable chair or stand up. 2. Without moving your shoulders, slowly tilt your left / right ear to your shoulder until you feel a stretch in your neck muscles. You should be looking straight ahead. 3. Hold for __________ seconds. 4. Repeat with the other side of your neck. Repeat __________ times. Complete this exercise __________ times a day. Exercise B: Cervical rotation  1. Using good posture, sit on a stable chair or stand up. 2. Slowly turn your head to the side as if you are looking over your left / right shoulder. ? Keep your eyes level with the ground. ? Stop when you feel a stretch along the side and the back of your neck. 3. Hold for __________ seconds. 4. Repeat this by turning to your other side. Repeat __________ times. Complete this exercise __________ times a day. Exercise C: Thoracic extension and pectoral stretch 1. Roll a towel or a small blanket so it is about 4 inches (10 cm) in diameter. 2. Lie down on your back on a firm surface. 3. Put the towel lengthwise, under your spine in the middle of your back. It should not be not under your shoulder blades. The towel should line up with your spine from your middle back to your lower back. 4. Put your hands behind your head and let your  elbows fall out to your sides. 5. Hold for __________ seconds. Repeat __________ times. Complete this exercise __________ times a day. Strengthening exercises These exercises build strength and endurance in your neck. Endurance is the ability to use your muscles for a long time, even after your muscles get tired. Exercise D: Upper cervical flexion, isometric 1. Lie on your back with a thin pillow behind your head and a small rolled-up towel under your neck. 2. Gently tuck your chin toward your chest and nod your head down to look toward your feet. Do not lift your head off the pillow. 3. Hold for __________ seconds. 4. Release the tension slowly. Relax your neck muscles completely before you repeat this exercise. Repeat __________ times. Complete this exercise __________ times a day. Exercise E: Cervical extension, isometric  1. Stand about 6 inches (15 cm) away from a wall, with your back facing the wall. 2. Place a soft object, about 6-8 inches (15-20 cm) in diameter, between the back of your head and the wall. A soft object could be a small pillow, a ball, or a folded towel. 3. Gently tilt your head back and press into the soft object. Keep your jaw and forehead relaxed. 4. Hold for __________ seconds. 5. Release the tension slowly. Relax your neck muscles completely before you repeat this exercise. Repeat __________ times. Complete this exercise __________ times a day. Posture and body mechanics Body mechanics refers to the movements and positions of your   body while you do your daily activities. Posture is part of body mechanics. Good posture and healthy body mechanics can help to relieve stress in your body's tissues and joints. Good posture means that your spine is in its natural S-curve position (your spine is neutral), your shoulders are pulled back slightly, and your head is not tipped forward. The following are general guidelines for applying improved posture and body mechanics to your  everyday activities. Standing   When standing, keep your spine neutral and keep your feet about hip-width apart. Keep a slight bend in your knees. Your ears, shoulders, and hips should line up.  When you do a task in which you stand in one place for a long time, place one foot up on a stable object that is 2-4 inches (5-10 cm) high, such as a footstool. This helps keep your spine neutral. Sitting   When sitting, keep your spine neutral and your keep feet flat on the floor. Use a footrest, if necessary, and keep your thighs parallel to the floor. Avoid rounding your shoulders, and avoid tilting your head forward.  When working at a desk or a computer, keep your desk at a height where your hands are slightly lower than your elbows. Slide your chair under your desk so you are close enough to maintain good posture.  When working at a computer, place your monitor at a height where you are looking straight ahead and you do not have to tilt your head forward or downward to look at the screen. Resting When lying down and resting, avoid positions that are most painful for you. Try to support your neck in a neutral position. You can use a contour pillow or a small rolled-up towel. Your pillow should support your neck but not push on it. This information is not intended to replace advice given to you by your health care provider. Make sure you discuss any questions you have with your health care provider. Document Released: 12/07/2005 Document Revised: 08/13/2016 Document Reviewed: 11/13/2015 Elsevier Interactive Patient Education  2019 [employer] Hand Exercises Hand exercises can be helpful to almost anyone. These exercises can strengthen the hands, improve flexibility and movement, and increase blood flow to the hands. These results can make work and daily tasks easier. Hand exercises can be especially helpful for people who have joint pain from arthritis or have nerve damage from overuse (carpal  tunnel syndrome). These exercises can also help people who have injured a hand. Most of these hand exercises are fairly gentle stretching routines. You can do them often throughout the day. Still, it is a good idea to ask your health care provider which exercises would be best for you. Warming your hands before exercise may help to reduce stiffness. You can do this with gentle massage or by placing your hands in warm water for 15 minutes. Also, make sure you pay attention to your level of hand pain as you begin an exercise routine. Exercises Knuckle bend Repeat this exercise 5-10 times with each hand. 5. Stand or sit with your arm, hand, and all five fingers pointed straight up. Make sure your wrist is straight. 6. Gently and slowly bend your fingers down and inward until the tips of your fingers are touching the tops of your palm. 7. Hold this position for a few seconds. 8. Extend your fingers out to their original position, all pointing straight up again. Finger fan Repeat this exercise 5-10 times with each hand. 1. Hold your arm and  hand out in front of you. Keep your wrist straight. 2. Squeeze your hand into a fist. 3. Hold this position for a few seconds. 4. Loel Dubonnet out, or spread apart, your hand and fingers as much as possible, stretching every joint fully. Tabletop Repeat this exercise 5-10 times with each hand. 6. Stand or sit with your arm, hand, and all five fingers pointed straight up. Make sure your wrist is straight. 7. Gently and slowly bend your fingers at the knuckles where they meet the hand until your hand is making an upside-down L shape. Your fingers should form a tabletop. 8. Hold this position for a few seconds. 9. Extend your fingers out to their original position, all pointing straight up again. Making Os Repeat this exercise 5-10 times with each hand. 5. Stand or sit with your arm, hand, and all five fingers pointed straight up. Make sure your wrist is straight. 6. Make  an O shape by touching your pointer finger to your thumb. Hold for a few seconds. Then open your hand wide. 7. Repeat this motion with each finger on your hand. Table spread Repeat this exercise 5-10 times with each hand. 6. Place your hand on a table with your palm facing down. Make sure your wrist is straight. 7. Spread your fingers out as much as possible. Hold this position for a few seconds. 8. Slide your fingers back together again. Hold for a few seconds. Ball grip Repeat this exercise 10-15 times with each hand. 1. Hold a tennis ball or another soft ball in your hand. 2. While slowly increasing pressure, squeeze the ball as hard as possible. 3. Squeeze as hard as you can for 3-5 seconds. 4. Relax and repeat.  Wrist curls Repeat this exercise 10-15 times with each hand. 1. Sit in a chair that has armrests. 2. Hold a light weight in your hand, such as a dumbbell that weighs 1-3 pounds (0.5-1.4 kg). Ask your health care provider what weight would be best for you. 3. Rest your hand just over the end of the chair arm with your palm facing up. 4. Gently pivot your wrist up and down while holding the weight. Do not twist your wrist from side to side. Contact a health care provider if:  Your hand pain or discomfort gets much worse when you do an exercise.  Your hand pain or discomfort does not improve within 2 hours after you exercise. If you have any of these problems, stop doing these exercises right away. Do not do them again unless your health care provider says that you can. Get help right away if:  You develop sudden, severe hand pain. If this happens, stop doing these exercises right away. Do not do them again unless your health care provider says that you can. This information is not intended to replace advice given to you by your health care provider. Make sure you discuss any questions you have with your health care provider. Document Released: 11/18/2015 Document Revised:  04/12/2018 Document Reviewed: 06/17/2015 Elsevier Interactive Patient Education  2019 [employer].

## 2019-04-17 ENCOUNTER — Encounter (HOSPITAL_COMMUNITY): Payer: Medicaid Other

## 2019-04-17 ENCOUNTER — Ambulatory Visit (HOSPITAL_COMMUNITY): Payer: Medicaid Other

## 2019-05-15 ENCOUNTER — Encounter (HOSPITAL_COMMUNITY): Payer: Medicaid Other

## 2019-05-16 ENCOUNTER — Encounter (HOSPITAL_COMMUNITY)
Admission: RE | Admit: 2019-05-16 | Discharge: 2019-05-16 | Disposition: A | Discharge status: Home / Self Care | Payer: Medicaid Other | Source: Ambulatory Visit | Attending: Cardiovascular Disease | Admitting: Cardiovascular Disease

## 2019-05-16 ENCOUNTER — Other Ambulatory Visit: Payer: Self-pay

## 2019-05-16 ENCOUNTER — Encounter (HOSPITAL_BASED_OUTPATIENT_CLINIC_OR_DEPARTMENT_OTHER)
Admission: RE | Admit: 2019-05-16 | Discharge: 2019-05-16 | Disposition: A | Discharge status: Home / Self Care | Payer: Medicaid Other | Source: Ambulatory Visit | Attending: Cardiovascular Disease | Admitting: Cardiovascular Disease

## 2019-05-16 DIAGNOSIS — R0609 Other forms of dyspnea: Secondary | ICD-10-CM | POA: Insufficient documentation

## 2019-05-16 DIAGNOSIS — R0600 Dyspnea, unspecified: Secondary | ICD-10-CM

## 2019-05-16 LAB — NM MYOCAR MULTI W/SPECT W/WALL MOTION / EF
LV dias vol: 118 mL (ref 62–150)
LV sys vol: 52 mL
Peak HR: 106 {beats}/min
RATE: 0.56
Rest HR: 79 {beats}/min
SDS: 6
SRS: 2
SSS: 8
TID: 1.13

## 2019-05-16 MED ORDER — TECHNETIUM TC 99M TETROFOSMIN IV KIT
30.0000 | PACK | Freq: Once | INTRAVENOUS | Status: AC
  Administered 2019-05-16: 31 via INTRAVENOUS

## 2019-05-16 MED ORDER — REGADENOSON 0.4 MG/5ML IV SOLN
INTRAVENOUS | Status: AC
  Administered 2019-05-16: 0.4 mg via INTRAVENOUS
  Filled 2019-05-16: qty 5

## 2019-05-16 MED ORDER — SODIUM CHLORIDE 0.9% FLUSH
INTRAVENOUS | Status: AC
  Administered 2019-05-16: 10 mL via INTRAVENOUS
  Filled 2019-05-16: qty 10

## 2019-05-16 MED ORDER — TECHNETIUM TC 99M TETROFOSMIN IV KIT
10.0000 | PACK | Freq: Once | INTRAVENOUS | Status: AC | PRN
  Administered 2019-05-16: 08:00:00 10.15 via INTRAVENOUS

## 2019-05-22 ENCOUNTER — Telehealth: Payer: Self-pay | Admitting: Neurology

## 2019-05-22 NOTE — Telephone Encounter (Signed)
05-22-19 Pt has called and gave verbal consent to file insurance for [email or web address] Email confirmed as:[email or web address]  Pt understands that although there may be some limitations with this type of visit, we will take all precautions to reduce any security or privacy concerns.  Pt understands that this will be treated like an in office visit and we will file with pt's insurance, and there may be a patient responsible charge related to this service. *Email sent*

## 2019-05-24 ENCOUNTER — Ambulatory Visit: Payer: Self-pay | Admitting: Neurology

## 2019-05-25 ENCOUNTER — Ambulatory Visit (INDEPENDENT_AMBULATORY_CARE_PROVIDER_SITE_OTHER): Payer: Medicaid Other | Admitting: Neurology

## 2019-05-25 ENCOUNTER — Ambulatory Visit: Payer: Medicaid Other | Admitting: Neurology

## 2019-05-25 ENCOUNTER — Encounter: Payer: Self-pay | Admitting: Neurology

## 2019-05-25 ENCOUNTER — Other Ambulatory Visit: Payer: Self-pay

## 2019-05-25 DIAGNOSIS — M545 Low back pain, unspecified: Secondary | ICD-10-CM | POA: Insufficient documentation

## 2019-05-25 DIAGNOSIS — M5441 Lumbago with sciatica, right side: Secondary | ICD-10-CM | POA: Diagnosis not present

## 2019-05-25 DIAGNOSIS — R202 Paresthesia of skin: Secondary | ICD-10-CM | POA: Insufficient documentation

## 2019-05-25 MED ORDER — GABAPENTIN 300 MG PO CAPS: 300.0000 mg | ORAL_CAPSULE | Freq: Three times a day (TID) | ORAL | 11 refills | Status: DC

## 2019-05-25 NOTE — Progress Notes (Signed)
PATIENT: Tony Meadows DOB: [birth year]  Virtual Visit via telephone  I connected with Tony Meadows on 05/25/19 at  by telephone and verified that I am speaking with the correct person using two identifiers.   I discussed the limitations, risks, security and privacy concerns of performing an evaluation and management service by telephone and the availability of in person appointments. I also discussed with the patient that there may be a patient responsible charge related to this service. The patient expressed understanding and agreed to proceed.  HISTORICAL  Tony Meadows is a 53 years old male, seen in request by Dr. Pollyann Savoy for evaluation of paresthesia.  I have reviewed and summarized the referring note from the referring physician.  He has past medical history of hyperlipidemia, rheumatoid arthritis, hypertension, adrenal insufficiency, taking prednisone  daily since 2013. He is also taking trazodone  qhs for chronic insomnia complains of mild depression  He complains of burning sensation from head to toes over the past couple years, was given Xanax 0.5 mg every night which seems to help him some,  He denies persistent fingertips or toes paresthesia, no weakness, he has no blurry vision  He has history of low back pain, I personally reviewed MRI of lumbar in June 2016: Stable distal syrinx, progressive leftward disc protrusion at L2-3, with moderate left foraminal stenosis, progressive rightward disc protrusion with severe right subarticular narrowing and progressive mild foraminal narrowing bilaterally at L4-5,  He had lumbar decompression surgery by Dr. Marikay Alar in March 2019 which has helped his low back pain, but still has occasionally radiating pain to right lower extremity, he denies significant gait abnormality, no incontinence   Observations/Objective: I have reviewed problem lists, medications, allergies. Awake, alert, oriented to history taking care of  conversation  Assessment and Plan: Paresthesia History of lumbar radiculopathy  EMG nerve conduction study  Gabapentin 300 mg 3 times a day  Follow Up Instructions:  EMG    I discussed the assessment and treatment plan with the patient. The patient was provided an opportunity to ask questions and all were answered. The patient agreed with the plan and demonstrated an understanding of the instructions.   The patient was advised to call back or seek an in-person evaluation if the symptoms worsen or if the condition fails to improve as anticipated.  I provided 30 minutes of non-face-to-face time during this encounter.  REVIEW OF SYSTEMS: Full 14 system review of systems performed and notable only for as above All other review of systems were negative.  ALLERGIES: No Known Allergies  HOME MEDICATIONS: Current Outpatient Medications  Medication Sig Dispense Refill  . ALPRAZolam (XANAX) 0.5 MG tablet Take 0.5 mg by mouth daily. Patient takes 1 MG daily    . aspirin EC 81 MG tablet Take 81 mg by mouth daily.     Marland Kitchen atorvastatin (LIPITOR) 20 MG tablet Take 20 mg by mouth daily.    Vedia Coffer Pepper-Turmeric (TURMERIC CURCUMIN) 04-999 MG CAPS Take 1 capsule by mouth daily.    . bumetanide (BUMEX) 1 MG tablet Take 1 mg by mouth daily.    . Coenzyme Q10 (COQ-10) 100 MG CAPS Take 1 capsule by mouth daily.    Marland Kitchen esomeprazole (NEXIUM) 40 MG capsule Take 40 mg by mouth daily before breakfast.    . gabapentin (NEURONTIN) 100 MG capsule Take 1 capsule by mouth daily as needed.    . Multiple Vitamin (MULTIVITAMIN WITH MINERALS) TABS Take 1 tablet by mouth daily.    Marland Kitchen  oxyCODONE (OXYCONTIN) 10 mg 12 hr tablet Take 10 mg by mouth daily. pain    . pantoprazole (PROTONIX) 40 MG tablet Take 40 mg by mouth daily.    . predniSONE (DELTASONE) 10 MG tablet Take 10 mg by mouth daily.    . traZODone (DESYREL) 100 MG tablet Take 100 mg by mouth at bedtime.     No current facility-administered medications for  this visit.     PAST MEDICAL HISTORY: Past Medical History:  Diagnosis Date  . Arthritis    Rheumatoid  . Bulging disc   . Complication of anesthesia    pt states that he had a lot of back pain after spinal with hernia repair  . GERD (gastroesophageal reflux disease)   . Hypertension   . Shortness of breath   . Sleep apnea    STOP BANG score = 4    PAST SURGICAL HISTORY: Past Surgical History:  Procedure Laterality Date  . CHOLECYSTECTOMY  10/12/2012   Procedure: LAPAROSCOPIC CHOLECYSTECTOMY;  Surgeon: Dalia HeadingMark A Jenkins, MD;  Location: [facility];  Service: General;  Laterality: N/A;  . ESOPHAGOGASTRODUODENOSCOPY  10/04/2012   Jenkins:gastritis, moderate  . HERNIA REPAIR    . Left foot repair     due to chain saw accident  . LUMBAR SPINE SURGERY    . PELVIC ABCESS DRAINAGE    . TONSILLECTOMY      FAMILY HISTORY: Family History  Problem Relation Age of Onset  . Cancer Mother   . Diabetes Mother   . Cancer Father   . Heart failure Sister   . Heart disease Brother        stent placement  . Liver cancer Brother     SOCIAL HISTORY:   Social History   Socioeconomic History  . Marital status: Married    Spouse name: Not on file  . Number of children: Not on file  . Years of education: Not on file  . Highest education level: Not on file  Occupational History  . Not on file  Social Needs  . Financial resource strain: Not on file  . Food insecurity:    Worry: Not on file    Inability: Not on file  . Transportation needs:    Medical: Not on file    Non-medical: Not on file  Tobacco Use  . Smoking status: Current Every Day Smoker    Packs/day: 2.00    Years: 41.00    Pack years: 82.00    Types: Cigarettes  . Smokeless tobacco: Never Used  Substance and Sexual Activity  . Alcohol use: No  . Drug use: No  . Sexual activity: Not on file  Lifestyle  . Physical activity:    Days per week: Not on file    Minutes per session: Not on file  . Stress: Not on file   Relationships  . Social connections:    Talks on phone: Not on file    Gets together: Not on file    Attends religious service: Not on file    Active member of club or organization: Not on file    Attends meetings of clubs or organizations: Not on file    Relationship status: Not on file  . Intimate partner violence:    Fear of current or ex partner: Not on file    Emotionally abused: Not on file    Physically abused: Not on file    Forced sexual activity: Not on file  Other Topics Concern  . Not on  file  Social History Narrative  . Not on file    Levert Feinstein, M.D. Ph.D.  [facility] [address], [address] [place], [place] [address] Ph: [phone] Fax: [phone]  CC: Pollyann Savoy, MD

## 2019-06-14 ENCOUNTER — Ambulatory Visit: Payer: Medicaid Other | Admitting: Cardiovascular Disease

## 2019-06-14 ENCOUNTER — Telehealth: Payer: Self-pay | Admitting: Cardiovascular Disease

## 2019-06-14 NOTE — Telephone Encounter (Signed)
Patient arrived for appointment with visitor. Patient was advised on confirmation yesterday that no visitors would be allowed. When patient was told that visitor would have to wait in car, patient refused to be seen. Stating that "he didn't trust visitor to be alone in the car". Stated no reschedule he would "wait til all this is over". Appointment cancelled. / tg

## 2019-08-09 ENCOUNTER — Encounter: Payer: Medicaid Other | Admitting: Neurology

## 2019-08-09 ENCOUNTER — Ambulatory Visit (INDEPENDENT_AMBULATORY_CARE_PROVIDER_SITE_OTHER): Payer: Medicaid Other | Admitting: Neurology

## 2019-08-09 ENCOUNTER — Other Ambulatory Visit: Payer: Self-pay

## 2019-08-09 DIAGNOSIS — Z0289 Encounter for other administrative examinations: Secondary | ICD-10-CM

## 2019-08-09 DIAGNOSIS — M5441 Lumbago with sciatica, right side: Secondary | ICD-10-CM

## 2019-08-09 DIAGNOSIS — R202 Paresthesia of skin: Secondary | ICD-10-CM

## 2019-08-09 NOTE — Procedures (Signed)
Full Name: Newman Nipaul Cobos Gender: Male MRN #: [id number] Date of Birth: May 06, 1966    Visit Date: 08/09/2019 11:07 Age: [age] Years 6 Months Old Examining Physician: Levert FeinsteinYijun Hayzlee Mcsorley, MD  Referring Physician: Levert FeinsteinYijun Foday Cone, MD History: 53 years old male presented with paresthesia, numbness from head to toes.  Summary of Tests:  Nerve Conduction Studies: Left sural, superficial peroneal sensory responses were normal.  Right sural, superficial peroneal sensory responses showed mildly decreased snap amplitude, with normal distal latency.  Lateral peroneal to EDB and tibial motor responses were normal.  Summary of the tests: Selective needle examination of bilateral lower extremity muscles and bilateral lumbosacral paraspinal muscles were normal.  Conclusion: This is a normal study.  There is no electrodiagnostic evidence of large fiber peripheral neuropathy or bilateral lumbosacral radiculopathy.    -----------------------  Levert FeinsteinYIjun Aicia Babinski, M.D. PhD  [facility] [address] [place], [place] [address] Tel: [phone] Fax: [phone]        [facility]    Nerve / Sites Muscle Latency Ref. Amplitude Ref. Rel Amp Segments Distance Velocity Ref. Area    ms ms mV mV %  cm m/s m/s mVms  R Peroneal - EDB     Ankle EDB 4.5 ?6.5 7.9 ?2.0 100 Ankle - EDB 9   25.1     Fib head EDB 11.9  6.1  77.3 Fib head - Ankle 33 45 ?44 22.9     Pop fossa EDB 14.2  5.1  83.7 Pop fossa - Fib head 10 44 ?44 20.2         Pop fossa - Ankle      L Peroneal - EDB     Ankle EDB 5.4 ?6.5 11.0 ?2.0 100 Ankle - EDB 9   30.2     Fib head EDB 12.5  10.4  94.2 Fib head - Ankle 32 45 ?44 30.2     Pop fossa EDB 14.7  10.0  96.2 Pop fossa - Fib head 10 45 ?44 30.0         Pop fossa - Ankle      R Tibial - AH     Ankle AH 3.2 ?5.8 8.7 ?4.0 100 Ankle - AH 9   19.0     Pop fossa AH 13.0  4.2  48.1 Pop fossa - Ankle 40 41 ?41 8.2  L Tibial - AH     Ankle AH 3.9 ?5.8 9.0 ?4.0 100 Ankle - AH 9   17.9     Pop fossa  AH 13.6  5.6  62.3 Pop fossa - Ankle 40 41 ?41 14.8             SNC    Nerve / Sites Rec. Site Peak Lat Ref.  Amp Ref. Segments Distance    ms ms V V  cm  R Sural - Ankle (Calf)     Calf Ankle 3.8 ?4.4 5 ?6 Calf - Ankle 14  L Sural - Ankle (Calf)     Calf Ankle 3.6 ?4.4 8 ?6 Calf - Ankle 14  R Superficial peroneal - Ankle     Lat leg Ankle 4.3 ?4.4 5 ?6 Lat leg - Ankle 14  L Superficial peroneal - Ankle     Lat leg Ankle 4.3 ?4.4 9 ?6 Lat leg - Ankle 14              F  Wave    Nerve F Lat Ref.   ms ms  R Peroneal - EDB 50.8 ?56.0  R Tibial - AH 56.6 ?56.0  L Peroneal - EDB 54.3 ?56.0  L Tibial - AH 56.3 ?56.0             EMG       EMG Summary Table    Spontaneous MUAP Recruitment  Muscle IA Fib PSW Fasc Other Amp Dur. Poly Pattern  R. Tibialis anterior Normal None None None _______ Normal Normal Normal Normal  R. Tibialis posterior Normal None None None _______ Normal Normal Normal Normal  R. Peroneus longus Normal None None None _______ Normal Normal Normal Normal  R. Gastrocnemius (Medial head) Normal None None None _______ Normal Normal Normal Normal  R. Vastus lateralis Normal None None None _______ Normal Normal Normal Normal  L. Tibialis anterior Normal None None None _______ Normal Normal Normal Normal  L. Tibialis posterior Normal None None None _______ Normal Normal Normal Normal  L. Gastrocnemius (Medial head) Normal None None None _______ Normal Normal Normal Normal  L. Peroneus longus Normal None None None _______ Normal Normal Normal Normal  L. Vastus lateralis Normal None None None _______ Normal Normal Normal Normal  L. Lumbar paraspinals (low) Normal None None None _______ Normal Normal Normal Normal  L. Lumbar paraspinals (mid) Normal None None None _______ Normal Normal Normal Normal  R. Lumbar paraspinals (low) Normal None None None _______ Normal Normal Normal Normal  R. Lumbar paraspinals (mid) Normal None None None _______ Normal Normal Normal Normal

## 2019-12-26 ENCOUNTER — Other Ambulatory Visit (HOSPITAL_COMMUNITY): Payer: Self-pay | Admitting: Internal Medicine

## 2019-12-26 DIAGNOSIS — M79672 Pain in left foot: Secondary | ICD-10-CM

## 2020-02-22 DIAGNOSIS — M79632 Pain in left forearm: Secondary | ICD-10-CM | POA: Insufficient documentation

## 2020-02-26 DIAGNOSIS — M25532 Pain in left wrist: Secondary | ICD-10-CM | POA: Insufficient documentation

## 2020-03-01 NOTE — Progress Notes (Signed)
Office Visit Note  Patient: Tony Meadows             Date of Birth: 1966/10/31           MRN: [id number]             PCP: Elfredia Nevins, MD Referring: Elfredia Nevins, MD Visit Date: 03/07/2020 Occupation: @GUAROCC @  Subjective:  Pain in multiple joints  History of Present Illness: Tony Meadows is a 54 y.o. male with history of osteoarthritis.  He is continues to have pain and discomfort in his bilateral hands.  He describes pain mostly in his bilateral CMC joints.  He states his right first MTP also hurts at times.  Although it lasted only for a few days and then resolves.  He continues to have some discomfort in his knee joints.  He is on oxycodone for pain management which relieves his discomfort to some extent.  He is also on chronic prednisone therapy for adrenal insufficiency.  Activities of Daily Living:  Patient reports joint stiffness all day  Patient Denies nocturnal pain.  Difficulty dressing/grooming: Reports Difficulty climbing stairs: Reports Difficulty getting out of chair: Reports Difficulty using hands for taps, buttons, cutlery, and/or writing: Reports  Review of Systems  Constitutional: Positive for fatigue. Negative for night sweats.  HENT: Positive for mouth dryness and nose dryness. Negative for mouth sores.   Eyes: Negative for redness, itching and dryness.  Respiratory: Positive for shortness of breath and difficulty breathing.        Due to COPD   Cardiovascular: Negative for chest pain, palpitations, hypertension, irregular heartbeat and swelling in legs/feet.  Gastrointestinal: Negative for blood in stool, constipation and diarrhea.  Endocrine: Negative for increased urination.  Genitourinary: Negative for difficulty urinating and painful urination.  Musculoskeletal: Positive for arthralgias, joint pain and morning stiffness. Negative for joint swelling, myalgias, muscle weakness, muscle tenderness and myalgias.  Skin: Negative for color change, rash,  hair loss, nodules/bumps, redness, skin tightness, ulcers and sensitivity to sunlight.  Allergic/Immunologic: Negative for susceptible to infections.  Neurological: Positive for numbness, memory loss and weakness. Negative for dizziness, fainting, headaches and night sweats.  Hematological: Positive for bruising/bleeding tendency. Negative for swollen glands.  Psychiatric/Behavioral: Negative for depressed mood, confusion and sleep disturbance. The patient is not nervous/anxious.     PMFS History:  Patient Active Problem List   Diagnosis Date Noted  . Paresthesia 05/25/2019  . Right-sided low back pain with right-sided sciatica 05/25/2019  . DDD (degenerative disc disease), lumbar 02/27/2019  . Primary osteoarthritis of both hands 02/27/2019  . Dyslipidemia 02/27/2019  . Tobacco use disorder 02/08/2019  . History of gastroesophageal reflux (GERD) 02/08/2019  . Essential hypertension 02/08/2019  . Peptic ulcer disease 02/08/2019  . Primary adrenocortical insufficiency (HCC) 02/08/2019  . Chronic pain syndrome 02/08/2019  . Abscess of deep perineal space 11/27/2016    Past Medical History:  Diagnosis Date  . Arthritis    Rheumatoid  . Bulging disc   . Complication of anesthesia    pt states that he had a lot of back pain after spinal with hernia repair  . GERD (gastroesophageal reflux disease)   . Hypertension   . Shortness of breath   . Sleep apnea    STOP BANG score = 4    Family History  Problem Relation Age of Onset  . Cancer Mother   . Diabetes Mother   . Cancer Father   . Heart failure Sister   . Heart disease Brother  stent placement  . Liver cancer Brother    Past Surgical History:  Procedure Laterality Date  . CHOLECYSTECTOMY  10/12/2012   Procedure: LAPAROSCOPIC CHOLECYSTECTOMY;  Surgeon: Dalia Heading, MD;  Location: [facility];  Service: General;  Laterality: N/A;  . ESOPHAGOGASTRODUODENOSCOPY  10/04/2012   Jenkins:gastritis, moderate  . HERNIA REPAIR     . Left foot repair     due to chain saw accident  . LUMBAR SPINE SURGERY    . PELVIC ABCESS DRAINAGE    . TONSILLECTOMY     Social History   Social History Narrative  . Not on file    There is no immunization history on file for this patient.   Objective: Vital Signs: BP (!) 143/79 (BP Location: Left Arm, Patient Position: Sitting, Cuff Size: Normal)   Pulse 86   Resp 16   Ht 5' 10.25" (1.784 m)   Wt 231 lb (104.8 kg)   BMI 32.91 kg/m    Physical Exam Vitals and nursing note reviewed.  Constitutional:      Appearance: He is well-developed.  HENT:     Head: Normocephalic and atraumatic.  Eyes:     Conjunctiva/sclera: Conjunctivae normal.     Pupils: Pupils are equal, round, and reactive to light.  Cardiovascular:     Rate and Rhythm: Normal rate and regular rhythm.     Heart sounds: Normal heart sounds.  Pulmonary:     Effort: Pulmonary effort is normal.     Breath sounds: Normal breath sounds.  Abdominal:     General: Bowel sounds are normal.     Palpations: Abdomen is soft.  Musculoskeletal:     Cervical back: Normal range of motion and neck supple.  Skin:    General: Skin is warm and dry.     Capillary Refill: Capillary refill takes less than 2 seconds.  Neurological:     Mental Status: He is alert and oriented to person, place, and time.  Psychiatric:        Behavior: Behavior normal.      Musculoskeletal Exam: C-spine thoracic and lumbar spine were in limited range of motion with lot of discomfort.  He painful range of motion of bilateral shoulders elbows wrist joints.  He had tenderness on palpation of bilateral CMC joints.  No synovitis was noted.  PIP and DIP thickening was noted.  Hip joints and knee joints also had painful range of motion without any warmth swelling or effusion.  CDAI Exam: CDAI Score: -- Patient Global: --; Provider Global: -- Swollen: --; Tender: -- Joint Exam 03/07/2020   No joint exam has been documented for this visit    There is currently no information documented on the homunculus. Go to the Rheumatology activity and complete the homunculus joint exam.  Investigation: No additional findings.  Imaging: No results found.  Recent Labs: Lab Results  Component Value Date   WBC 13.9 (H) 02/08/2019   HGB 16.4 02/08/2019   PLT 200 02/08/2019   NA 143 02/08/2019   K 4.6 02/08/2019   CL 106 02/08/2019   CO2 30 02/08/2019   GLUCOSE 96 02/08/2019   BUN 19 02/08/2019   CREATININE 0.91 02/08/2019   BILITOT 0.3 02/08/2019   ALKPHOS 91 11/01/2013   AST 14 02/08/2019   ALT 18 02/08/2019   PROT 6.4 02/08/2019   ALBUMIN 3.8 11/01/2013   CALCIUM 9.6 02/08/2019   GFRAA 110 02/08/2019    Speciality Comments: No specialty comments available.  Procedures:  No procedures performed  Allergies: Patient has no known allergies.   Assessment / Plan:     Visit Diagnoses: Primary osteoarthritis of both hands-complains of lot of pain and discomfort in his bilateral hands especially over the [facility] joints.  I offered cortisone injection which she declined.  Use of Voltaren gel and CMC braces was discussed.  Polyarthralgia-he has pain in almost all of his joints.  He states despite taking medications he is in chronic discomfort.  DDD (degenerative disc disease), lumbar - s/p discectomy 03/12/18 by Dr. Ronnald Ramp.  Chronic pain and limited range of motion.  Chronic pain syndrome-he goes to pain management and he is on multiple medications.  Paresthesia of upper and lower extremities of both sides  Essential hypertension  Primary adrenocortical insufficiency (HCC)-he is on chronic steroid therapy.  History of gastroesophageal reflux (GERD)  Dyslipidemia  Tobacco use disorder  Orders: No orders of the defined types were placed in this encounter.  No orders of the defined types were placed in this encounter.    Follow-Up Instructions: Return in about 1 year (around 03/07/2021) for Osteoarthritis.   Bo Merino, MD  Note - This record has been created using Editor, commissioning.  Chart creation errors have been sought, but may not always  have been located. Such creation errors do not reflect on  the standard of medical care.

## 2020-03-07 ENCOUNTER — Ambulatory Visit: Payer: Medicaid Other | Admitting: Rheumatology

## 2020-03-07 ENCOUNTER — Other Ambulatory Visit: Payer: Self-pay

## 2020-03-07 ENCOUNTER — Encounter: Payer: Self-pay | Admitting: Rheumatology

## 2020-03-07 VITALS — BP 143/79 | HR 86 | Resp 16 | Ht 70.25 in | Wt 231.0 lb

## 2020-03-07 DIAGNOSIS — M2550 Pain in unspecified joint: Secondary | ICD-10-CM | POA: Diagnosis not present

## 2020-03-07 DIAGNOSIS — F17200 Nicotine dependence, unspecified, uncomplicated: Secondary | ICD-10-CM

## 2020-03-07 DIAGNOSIS — Z8719 Personal history of other diseases of the digestive system: Secondary | ICD-10-CM

## 2020-03-07 DIAGNOSIS — G894 Chronic pain syndrome: Secondary | ICD-10-CM

## 2020-03-07 DIAGNOSIS — M19041 Primary osteoarthritis, right hand: Secondary | ICD-10-CM | POA: Diagnosis not present

## 2020-03-07 DIAGNOSIS — M51369 Other intervertebral disc degeneration, lumbar region without mention of lumbar back pain or lower extremity pain: Secondary | ICD-10-CM

## 2020-03-07 DIAGNOSIS — E271 Primary adrenocortical insufficiency: Secondary | ICD-10-CM

## 2020-03-07 DIAGNOSIS — R202 Paresthesia of skin: Secondary | ICD-10-CM

## 2020-03-07 DIAGNOSIS — M19042 Primary osteoarthritis, left hand: Secondary | ICD-10-CM

## 2020-03-07 DIAGNOSIS — I10 Essential (primary) hypertension: Secondary | ICD-10-CM

## 2020-03-07 DIAGNOSIS — E785 Hyperlipidemia, unspecified: Secondary | ICD-10-CM

## 2020-03-07 DIAGNOSIS — M5136 Other intervertebral disc degeneration, lumbar region: Secondary | ICD-10-CM

## 2020-06-12 ENCOUNTER — Other Ambulatory Visit: Payer: Self-pay | Admitting: Neurology

## 2020-06-20 ENCOUNTER — Encounter: Payer: Self-pay | Admitting: Internal Medicine

## 2020-06-20 ENCOUNTER — Other Ambulatory Visit: Payer: Self-pay

## 2020-06-20 ENCOUNTER — Ambulatory Visit (INDEPENDENT_AMBULATORY_CARE_PROVIDER_SITE_OTHER): Payer: Medicare Other | Admitting: Internal Medicine

## 2020-06-20 DIAGNOSIS — J449 Chronic obstructive pulmonary disease, unspecified: Secondary | ICD-10-CM | POA: Diagnosis not present

## 2020-06-20 DIAGNOSIS — F17210 Nicotine dependence, cigarettes, uncomplicated: Secondary | ICD-10-CM | POA: Diagnosis not present

## 2020-06-20 MED ORDER — BREZTRI AEROSPHERE 160-9-4.8 MCG/ACT IN AERO: 2.0000 | INHALATION_SPRAY | Freq: Two times a day (BID) | RESPIRATORY_TRACT | 0 refills | Status: DC

## 2020-06-20 MED ORDER — BREZTRI AEROSPHERE 160-9-4.8 MCG/ACT IN AERO: 2.0000 | INHALATION_SPRAY | Freq: Two times a day (BID) | RESPIRATORY_TRACT | 11 refills | Status: DC

## 2020-06-20 NOTE — Patient Instructions (Addendum)
Pull office records from Dr Juanetta Gosling   Plan A = Automatic = Always=   Breztri Take 2 puffs first thing in am and then another 2 puffs about 12 hours later.    Work on inhaler technique:  relax and gently blow all the way out then take a nice smooth deep breath back in, triggering the inhaler at same time you start breathing in.  Hold for up to 5 seconds if you can. Blow out thru nose. Rinse and gargle with water when done     Plan B = Backup (to supplement plan A, not to replace it) Only use your albuterol (proair) inhaler as a rescue medication to be used if you can't catch your breath by resting or doing a relaxed purse lip breathing pattern.  - The less you use it, the better it will work when you need it. - Ok to use the inhaler up to 2 puffs  every 4 hours if you must but call for appointment if use goes up over your usual need - Don't leave home without it !!  (think of it like the spare tire for your car)   I strongly recommend the moderna or pfizer vaccine especially if you hear about the delta variant in our area  Please schedule a follow up visit in 3 months but call sooner if needed

## 2020-06-20 NOTE — Assessment & Plan Note (Addendum)
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  Total time for H and P, chart review, counseling, teaching device and generating customized AVS unique to this office visit / charting = 45 min

## 2020-06-20 NOTE — Progress Notes (Signed)
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 Pulmonary Consult    Former pt of Dr Juanetta Gosling. Pt states started having SOB July 2020- advair had helped in the past but ran out approx 2 months ago. He uses albuterol 2-3 x per day- has issues with breathing when the temp changes and with exertion.  Dyspnea:  MMRC2 = can't walk a nl pace on a flat grade s sob but does fine slow and flat  Cough: none  Sleep: no problem blocks < 30 degrees  SABA use:  Several times a dat saba mostly with exertion   .No obvious day to day or daytime variability or assoc excess/ purulent sputum or mucus plugs or hemoptysis or cp or chest tightness, subjective wheeze or overt sinus or hb symptoms.   Sleeping as above  without nocturnal  or early am exacerbation  of respiratory  c/o's or need for noct saba. Also denies any obvious fluctuation of symptoms with weather or environmental changes or other aggravating or alleviating factors except as outlined above   No unusual exposure hx or h/o childhood pna/ asthma or knowledge of premature birth.  Current Allergies, Complete Past Medical History, Past Surgical History, Family History, and Social History were reviewed in [employer] record.  ROS  The following are not active complaints unless bolded Hoarseness, sore throat, dysphagia, dental problems, itching, sneezing,  nasal congestion or discharge of excess mucus or purulent secretions, ear ache,   fever,  chills, sweats, unintended wt loss or wt gain, classically pleuritic or exertional cp,  orthopnea pnd or arm/hand swelling  or leg swelling, presyncope, palpitations, abdominal pain, anorexia, nausea, vomiting, diarrhea  or change in bowel habits or change in bladder habits, change in stools or change in urine, dysuria, hematuria,  rash, arthralgias, visual complaints, headache, numbness, weakness or ataxia or problems with walking or coordination,  change in mood or  memory.             Past Medical History:  Diagnosis Date  . Arthritis    Rheumatoid  . Bulging disc   . Complication of anesthesia    pt states that he had a lot of back pain after spinal with hernia repair  . GERD (gastroesophageal reflux disease)   . Hypertension   . Shortness of breath   . Sleep apnea    STOP BANG score = 4    Outpatient Medications Prior to Visit  Medication Sig Dispense Refill  . amLODipine (NORVASC) 5 MG tablet Take 5 mg by mouth daily.    Marland Kitchen aspirin EC 81 MG tablet Take 81 mg by mouth daily.     Marland Kitchen atorvastatin (LIPITOR) 20 MG tablet Take 20 mg by mouth daily.    Vedia Coffer Pepper-Turmeric (TURMERIC CURCUMIN) 04-999 MG CAPS Take 1 capsule by mouth daily.    . Coenzyme Q10 (COQ-10) 100 MG CAPS Take 1 capsule  by mouth daily.    Marland Kitchen oxyCODONE (OXYCONTIN) 10 mg 12 hr tablet Take 10 mg by mouth daily. pain    . pantoprazole (PROTONIX) 40 MG tablet Take 40 mg by mouth 2 (two) times daily.     . predniSONE (DELTASONE) 10 MG tablet Take 10 mg by mouth daily.    Marland Kitchen tiZANidine (ZANAFLEX) 4 MG capsule Take 4 mg by mouth at bedtime.    . traZODone (DESYREL) 100 MG tablet Take 100 mg by mouth at bedtime.    . ALPRAZolam (XANAX) 0.5 MG tablet Take 0.5 mg by mouth daily. Patient takes 1 MG daily    . bumetanide (BUMEX) 1 MG tablet Take 1 mg by mouth daily.    Marland Kitchen esomeprazole (NEXIUM) 40 MG capsule Take 40 mg by mouth daily before breakfast.    . gabapentin (NEURONTIN) 100 MG capsule Take 1 capsule by mouth daily as  needed.    . gabapentin (NEURONTIN) 300 MG capsule Take one cap three times daily. Please call [phone] to schedule follow up or may discuss refills w/ PCP. 90 capsule 3  . Multiple Vitamin (MULTIVITAMIN WITH MINERALS) TABS Take 1 tablet by mouth daily.        Objective:     BP 124/74 (BP Location: Left Arm, Cuff Size: Normal)   Pulse 82   Temp 98.8 F (37.1 C) (Oral)   Ht 5' 10.5" (1.791 m)   Wt 215 lb (97.5 kg)   SpO2 96% Comment: on RA  BMI 30.41 kg/m   SpO2: 96 % (on RA)  Pleasant amb wm nad  HEENT : pt wearing mask not removed for exam due to covid - 19 concerns.   NECK :  without JVD/Nodes/TM/ nl carotid upstrokes bilaterally   LUNGS: no acc muscle use,  Min barrel  contour chest wall with bilateral  slightly decreased bs s audible wheeze and  without cough on insp or exp maneuvers and min  Hyperresonant  to  percussion bilaterally     CV:  RRR  no s3 or murmur or increase in P2, and no edema   ABD:  Obese soft and nontender with pos end  insp Hoover's  in the supine position. No bruits or organomegaly appreciated, bowel sounds nl  MS:   Nl gait/  ext warm without deformities, calf tenderness, cyanosis or clubbing No obvious joint restrictions   SKIN: warm and dry without lesions    NEURO:  alert, approp, nl sensorium with  no motor or cerebellar deficits apparent.     Declined  cxr and ldsct       Assessment   COPD GOLD?  / active smoker  Active smoker/ pfts req from Dr Juanetta Gosling files  - 06/20/2020  After extensive coaching inhaler device,  effectiveness =   90% > start breztri     Group D in terms of symptom/risk and laba/lama/ICS  therefore appropriate rx at this point >>>  breztri approp for now but since he takes pred 10 mg daily anyway for presumed adrenal insuff (which is more than a usual physiologic dose in a healthy pt ) it's might be just as reasonable to change to Bevespi and avoid ICS uppper airway side effects  I spent extra time with pt today  reviewing appropriate use of albuterol for prn use on exertion with the following points: 1) saba is for relief of sob that does not improve by walking a slower pace or resting but rather if the pt does not improve after  trying this first. 2) If the pt is convinced, as many are, that saba helps recover from activity faster then it's easy to tell if this is the case by re-challenging : ie stop, take the inhaler, then p 5 minutes try the exact same activity (intensity of workload) that just caused the symptoms and see if they are substantially diminished or not after saba 3) if there is an activity that reproducibly causes the symptoms, try the saba 15 min before the activity on alternate days   If in fact the saba really does help, then fine to continue to use it prn but advised may need to look closer at the maintenance regimen being used to achieve better control of airways disease with exertion.   Pt informed of the seriousness of COVID 19 infection as a direct risk to lung health  and safey and to close contacts and should continue to wear a facemask in public and minimize exposure to public locations but especially avoid any area or activity where non-close contacts are not observing distancing or wearing an appropriate face mask.  I strongly recommended she take either of the vaccines available through local drugstores based on updated information on millions of Americans treated with the Moderna and [employer] products  which have proven both safe and  effective even against the new delta variant.       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  Total time for H and P, chart review, counseling, teaching device and generating customized AVS unique to this office visit / charting = 45 min           Sandrea Hughs, MD 06/20/2020

## 2020-06-20 NOTE — Assessment & Plan Note (Addendum)
Active smoker/ pfts req from Dr Juanetta Gosling files  - 06/20/2020  After extensive coaching inhaler device,  effectiveness =   90% > start breztri     Group D in terms of symptom/risk and laba/lama/ICS  therefore appropriate rx at this point >>>  breztri approp for now but since he takes pred 10 mg daily anyway for presumed adrenal insuff (which is more than a usual physiologic dose in a healthy pt ) it's might be just as reasonable to change to Bevespi and avoid ICS uppper airway side effects  I spent extra time with pt today reviewing appropriate use of albuterol for prn use on exertion with the following points: 1) saba is for relief of sob that does not improve by walking a slower pace or resting but rather if the pt does not improve after trying this first. 2) If the pt is convinced, as many are, that saba helps recover from activity faster then it's easy to tell if this is the case by re-challenging : ie stop, take the inhaler, then p 5 minutes try the exact same activity (intensity of workload) that just caused the symptoms and see if they are substantially diminished or not after saba 3) if there is an activity that reproducibly causes the symptoms, try the saba 15 min before the activity on alternate days   If in fact the saba really does help, then fine to continue to use it prn but advised may need to look closer at the maintenance regimen being used to achieve better control of airways disease with exertion.   Pt informed of the seriousness of COVID 19 infection as a direct risk to lung health  and safey and to close contacts and should continue to wear a facemask in public and minimize exposure to public locations but especially avoid any area or activity where non-close contacts are not observing distancing or wearing an appropriate face mask.  I strongly recommended she take either of the vaccines available through local drugstores based on updated information on millions of Americans treated with  the Moderna and [employer] products  which have proven both safe and  effective even against the new delta variant.

## 2020-08-30 ENCOUNTER — Telehealth: Payer: Self-pay | Admitting: Internal Medicine

## 2020-08-30 MED ORDER — BUDESONIDE-FORMOTEROL FUMARATE 160-4.5 MCG/ACT IN AERO: 2.0000 | INHALATION_SPRAY | Freq: Two times a day (BID) | RESPIRATORY_TRACT | 6 refills | Status: DC

## 2020-08-30 NOTE — Telephone Encounter (Signed)
symbicort 160 2bid x one month but be sure has f/u ov before refills to be sure this is adequate

## 2020-08-30 NOTE — Telephone Encounter (Signed)
PA request received from [facility]  Drug requested: Markus Daft  CMM Key: [facility] Tried/failed: Advair Covered alternatives: spiriva hh/respimat, tudorza, incruse, advair discus, breo, symbicort, stiolto, anoro, trelegy, striverdi, serevent PA request has been sent to plan, and a determination is expected within 3 days.   Routing to [place] for follow-up.

## 2020-08-30 NOTE — Telephone Encounter (Signed)
Called patient, left a vm letting patient know inhaler has been changed to symbicort bid and sent to pharmacy on file.  Advised to call the office with any questions.

## 2020-10-18 ENCOUNTER — Other Ambulatory Visit: Payer: Self-pay | Admitting: Neurology

## 2021-08-22 ENCOUNTER — Ambulatory Visit: Payer: Medicare Other | Admitting: Internal Medicine

## 2021-10-21 ENCOUNTER — Ambulatory Visit (HOSPITAL_COMMUNITY)
Admission: RE | Admit: 2021-10-21 | Discharge: 2021-10-21 | Disposition: A | Discharge status: Home / Self Care | Payer: Medicare Other | Source: Ambulatory Visit | Attending: Internal Medicine | Admitting: Internal Medicine

## 2021-10-21 ENCOUNTER — Other Ambulatory Visit: Payer: Self-pay

## 2021-10-21 ENCOUNTER — Encounter: Payer: Self-pay | Admitting: Internal Medicine

## 2021-10-21 ENCOUNTER — Ambulatory Visit (INDEPENDENT_AMBULATORY_CARE_PROVIDER_SITE_OTHER): Payer: Medicare Other | Admitting: Internal Medicine

## 2021-10-21 VITALS — BP 140/78 | HR 85 | Temp 98.7°F | Ht 70.0 in | Wt 199.1 lb

## 2021-10-21 DIAGNOSIS — J449 Chronic obstructive pulmonary disease, unspecified: Secondary | ICD-10-CM | POA: Diagnosis not present

## 2021-10-21 DIAGNOSIS — F17200 Nicotine dependence, unspecified, uncomplicated: Secondary | ICD-10-CM

## 2021-10-21 DIAGNOSIS — F17210 Nicotine dependence, cigarettes, uncomplicated: Secondary | ICD-10-CM | POA: Diagnosis not present

## 2021-10-21 DIAGNOSIS — Z23 Encounter for immunization: Secondary | ICD-10-CM

## 2021-10-21 MED ORDER — AZITHROMYCIN 250 MG PO TABS: ORAL_TABLET | ORAL | 0 refills | Status: DC

## 2021-10-21 MED ORDER — BEVESPI AEROSPHERE 9-4.8 MCG/ACT IN AERO: INHALATION_SPRAY | RESPIRATORY_TRACT | 11 refills | Status: DC

## 2021-10-21 NOTE — Patient Instructions (Addendum)
We will refer you to our lung cancer screening  program   Change protonix 40 mg Take 30- 60 min before your first and last meals of the day to see if helps cough   Zpak   The key is to stop smoking completely before smoking completely stops you!   Plan A = Automatic = Always=    Bevespi Take 2 puffs first thing in am and then another 2 puffs about 12 hours later.     Plan B = Backup (to supplement plan A, not to replace it) Only use your albuterol inhaler as a rescue medication to be used if you can't catch your breath by resting or doing a relaxed purse lip breathing pattern.  - The less you use it, the better it will work when you need it. - Ok to use the inhaler up to 2 puffs  every 4 hours if you must but call for appointment if use goes up over your usual need - Don't leave home without it !!  (think of it like starter fluid for your car)   Ok to try albuterol 15 min before an activity (on alternating days)  that you know would usually make you short of breath and see if it makes any difference and if makes none then don't take albuterol after activity unless you can't catch your breath as this means it's the resting that helps, not the albuterol.      We will schedule you for PFT's next available at [facility]  Please remember to go to the  x-ray department  @  [facility] for your tests - we will call you with the results when they are available     Please schedule a follow up visit in 12  months but call sooner if needed

## 2021-10-21 NOTE — Progress Notes (Signed)
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 Pt states started having SOB July 2020- advair had helped in the past but ran out approx 2 months ago. He uses albuterol 2-3 x per day- has issues with breathing when the temp changes and with exertion.  Dyspnea:  MMRC2 = can't walk a nl pace on a flat grade s sob but does fine slow and flat  Cough: none  Sleep: no problem blocks < 30 degrees  SABA use:  Several times a dat saba mostly with exertion  Rec Plan A = Automatic = Always=   Breztri Take 2 puffs first thing in am and then another 2 puffs about 12 hours later.  Work on inhaler technique:    Plan B = Backup (to supplement plan A, not to replace it) Only use your albuterol (proair) inhaler as a rescue medication  I strongly recommend the moderna or [employer]izer vaccine especially if you hear about the delta variant in our area Please schedule a follow up visit in 3 months but call sooner if needed     10/21/2021  f/u ov/[place] office/Seung Nidiffer re: copd GOLD ? Still smoking  maint on symbi2bid   Chief Complaint  Patient presents with   Follow-up    Sob and cough are the same since last OV. Had a cold a month ago and feels like he's been sick since. Coughing up yellow mucus,    Dyspnea:  walks dog x 30 min ok flat ok/ main problem is hills  Cough: worse in am / yellow  Sleeping: fine board under mattress  SABA use:  uses with activity (not the  instructions he was given, only uses after ex 02: none  Covid status: vax never/ never infected  Lung cancer screening: referred    No obvious other patterns in day to day or daytime symptoms or assoc  mucus plugs or hemoptysis or cp or chest tightness, subjective wheeze or overt sinus or hb symptoms.   Sleeping as aboe without nocturnal  or early am exacerbation  of respiratory  c/o's or need for noct saba. Also denies any obvious fluctuation of symptoms with weather or environmental changes or other aggravating or alleviating factors except as outlined above   No unusual exposure hx or h/o childhood pna/ asthma or knowledge of premature birth.  Current Allergies, Complete Past Medical History, Past Surgical History, Family History, and Social History were reviewed in [employer] record.  ROS  The following are not active complaints unless bolded Hoarseness, sore throat, dysphagia, dental problems, itching, sneezing,  nasal congestion or discharge of excess mucus or purulent secretions, ear ache,   fever, chills, sweats, unintended wt loss or wt gain, classically pleuritic or exertional cp,  orthopnea pnd or arm/hand swelling  or leg swelling, presyncope, palpitations, abdominal pain, anorexia, nausea, vomiting,  diarrhea  or change in bowel habits or change in bladder habits, change in stools or change in urine, dysuria, hematuria,  rash, arthralgias, visual complaints, headache, numbness, weakness or ataxia or problems with walking or coordination,  change in mood or  memory.        Current Meds  Medication Sig   amLODipine (NORVASC) 5 MG tablet Take 5 mg by mouth daily.   aspirin EC 81 MG tablet Take 81 mg by mouth daily.    Black Pepper-Turmeric (TURMERIC CURCUMIN) 04-999 MG CAPS Take 1 capsule by mouth daily.   budesonide-formoterol (SYMBICORT) 160-4.5 MCG/ACT inhaler Inhale 2 puffs into the lungs 2 (two) times daily.   Coenzyme Q10 (COQ-10) 100 MG CAPS Take 1  capsule by mouth daily.   oxyCODONE (OXYCONTIN) 10 mg 12 hr tablet Take 10 mg by mouth daily. pain   pantoprazole (PROTONIX) 40 MG tablet Take 40 mg by mouth 2 (two) times daily.    predniSONE (DELTASONE) 10 MG tablet Take 10 mg by mouth daily.   tiZANidine (ZANAFLEX) 4 MG capsule Take 4 mg by mouth at bedtime.   traZODone (DESYREL) 100 MG tablet Take 150 mg by mouth at bedtime.                Past Medical History:  Diagnosis Date   Arthritis    Rheumatoid   Bulging disc    Complication of anesthesia    pt states that he had a lot of back pain after spinal with hernia repair   GERD (gastroesophageal reflux disease)    Hypertension    Shortness of breath    Sleep apnea    STOP BANG score = 4      Objective:      Wt Readings from Last 3 Encounters:  10/21/21 199 lb 1.3 oz (90.3 kg)  06/20/20 215 lb (97.5 kg)  03/07/20 231 lb (104.8 kg)      Vital signs reviewed  10/21/2021  - Note at rest 02 sats  98% on RA   General appearance:    amb wm looks older than stated age     55 : pt wearing mask not removed for exam due to covid - 19 concerns.   NECK :  without JVD/Nodes/TM/ nl carotid upstrokes bilaterally   LUNGS: no acc muscle use,  Min barrel  contour chest wall with bilateral  slightly decreased bs s audible wheeze and  without cough on insp or exp maneuvers and min  Hyperresonant  to  percussion bilaterally     CV:  RRR  no s3 or murmur or increase in P2, and no edema   ABD:  soft and nontender with pos end  insp Hoover's  in the supine position. No bruits or organomegaly appreciated, bowel sounds nl  MS:   Nl gait/  ext warm without deformities, calf tenderness, cyanosis or clubbing No obvious joint restrictions   SKIN: warm and dry without lesions    NEURO:  alert, approp, nl sensorium with  no motor or cerebellar deficits apparent.        CXR PA and Lateral:   10/21/2021 :    I personally reviewed images and impression is as follows:     Mild cm/ mod  copd      Assessment

## 2021-10-22 ENCOUNTER — Encounter: Payer: Self-pay | Admitting: Internal Medicine

## 2021-10-22 NOTE — Assessment & Plan Note (Signed)
Active smoker  - 06/20/2020  After extensive coaching inhaler device,  effectiveness =   90% > start breztri     Group D in terms of symptom/risk and laba/lama/ICS  therefore appropriate rx at this point >>>  Breztri 2bid and prn saba  Re saba: Re SABA :  I spent extra time with pt today reviewing appropriate use of albuterol for prn use on exertion with the following points: 1) saba is for relief of sob that does not improve by walking a slower pace or resting but rather if the pt does not improve after trying this first. 2) If the pt is convinced, as many are, that saba helps recover from activity faster then it's easy to tell if this is the case by re-challenging : ie stop, take the inhaler, then p 5 minutes try the exact same activity (intensity of workload) that just caused the symptoms and see if they are substantially diminished or not after saba 3) if there is an activity that reproducibly causes the symptoms, try the saba 15 min before the activity on alternate days   If in fact the saba really does help, then fine to continue to use it prn but advised may need to look closer at the maintenance regimen being used to achieve better control of airways disease with exertion.    zpak for mild flare plus while coughing rec increase ppi to bid ac and work harder on stop smoking    Needs alpha one screen / pfts

## 2021-10-22 NOTE — Assessment & Plan Note (Signed)
Counseled re importance of smoking cessation but did not meet time criteria for separate billing   ? ?    ?Each maintenance medication was reviewed in detail including emphasizing most importantly the difference between maintenance and prns and under what circumstances the prns are to be triggered using an action plan format where appropriate. ? ?Total time for H and P, chart review, counseling, reviewing hfa device(s) and generating customized AVS unique to this office visit / same day charting = 31 min  ?     ?

## 2021-12-24 DIAGNOSIS — I10 Essential (primary) hypertension: Secondary | ICD-10-CM | POA: Diagnosis not present

## 2021-12-24 DIAGNOSIS — Z6829 Body mass index (BMI) 29.0-29.9, adult: Secondary | ICD-10-CM | POA: Diagnosis not present

## 2021-12-24 DIAGNOSIS — E6609 Other obesity due to excess calories: Secondary | ICD-10-CM | POA: Diagnosis not present

## 2021-12-24 DIAGNOSIS — G894 Chronic pain syndrome: Secondary | ICD-10-CM | POA: Diagnosis not present

## 2021-12-24 DIAGNOSIS — M5412 Radiculopathy, cervical region: Secondary | ICD-10-CM | POA: Diagnosis not present

## 2021-12-24 DIAGNOSIS — J449 Chronic obstructive pulmonary disease, unspecified: Secondary | ICD-10-CM | POA: Diagnosis not present

## 2021-12-24 DIAGNOSIS — M159 Polyosteoarthritis, unspecified: Secondary | ICD-10-CM | POA: Diagnosis not present

## 2021-12-26 IMAGING — CR DG CHEST 2V
2 series · 2 of 2 positions shown · non-contrast
Comparison: 04/19/2018

CLINICAL DATA: COPD. Flu like symptoms 1 month ago with persistent
productive cough.

EXAM:
CHEST - 2 VIEW

[w pa chest]
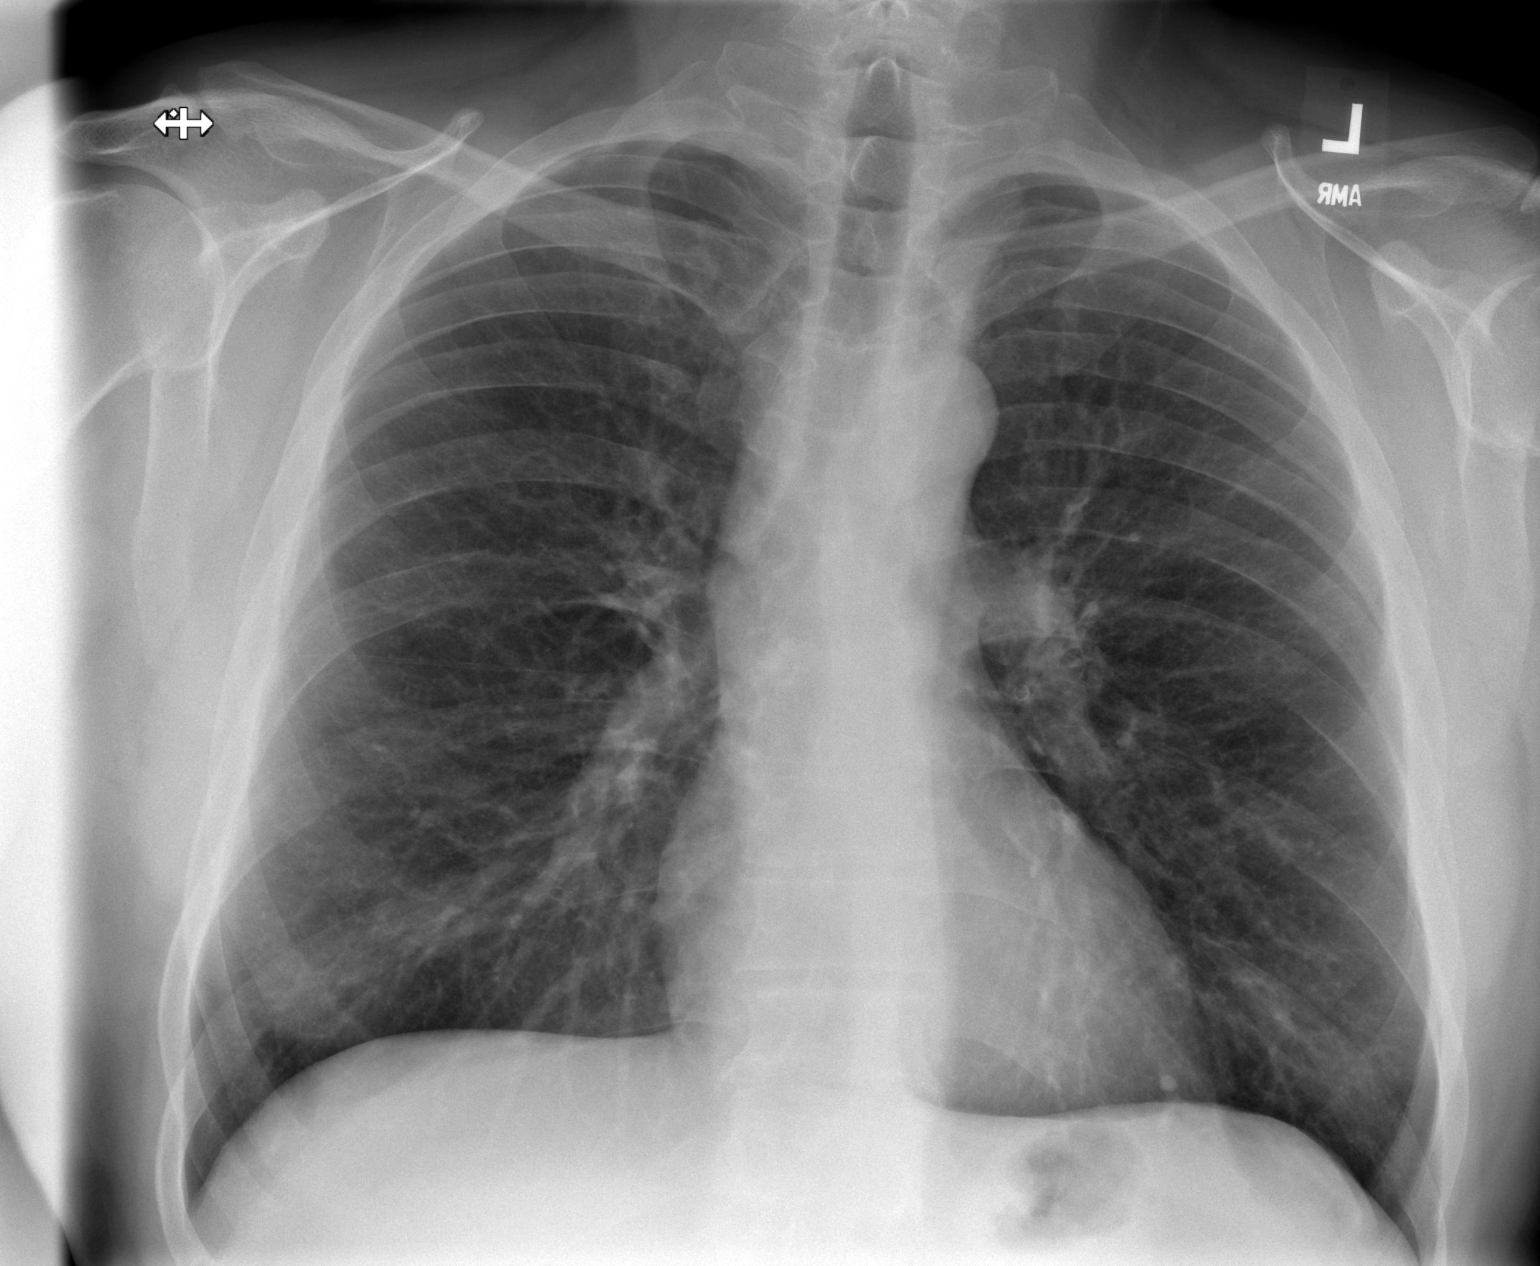

[w chest lat]
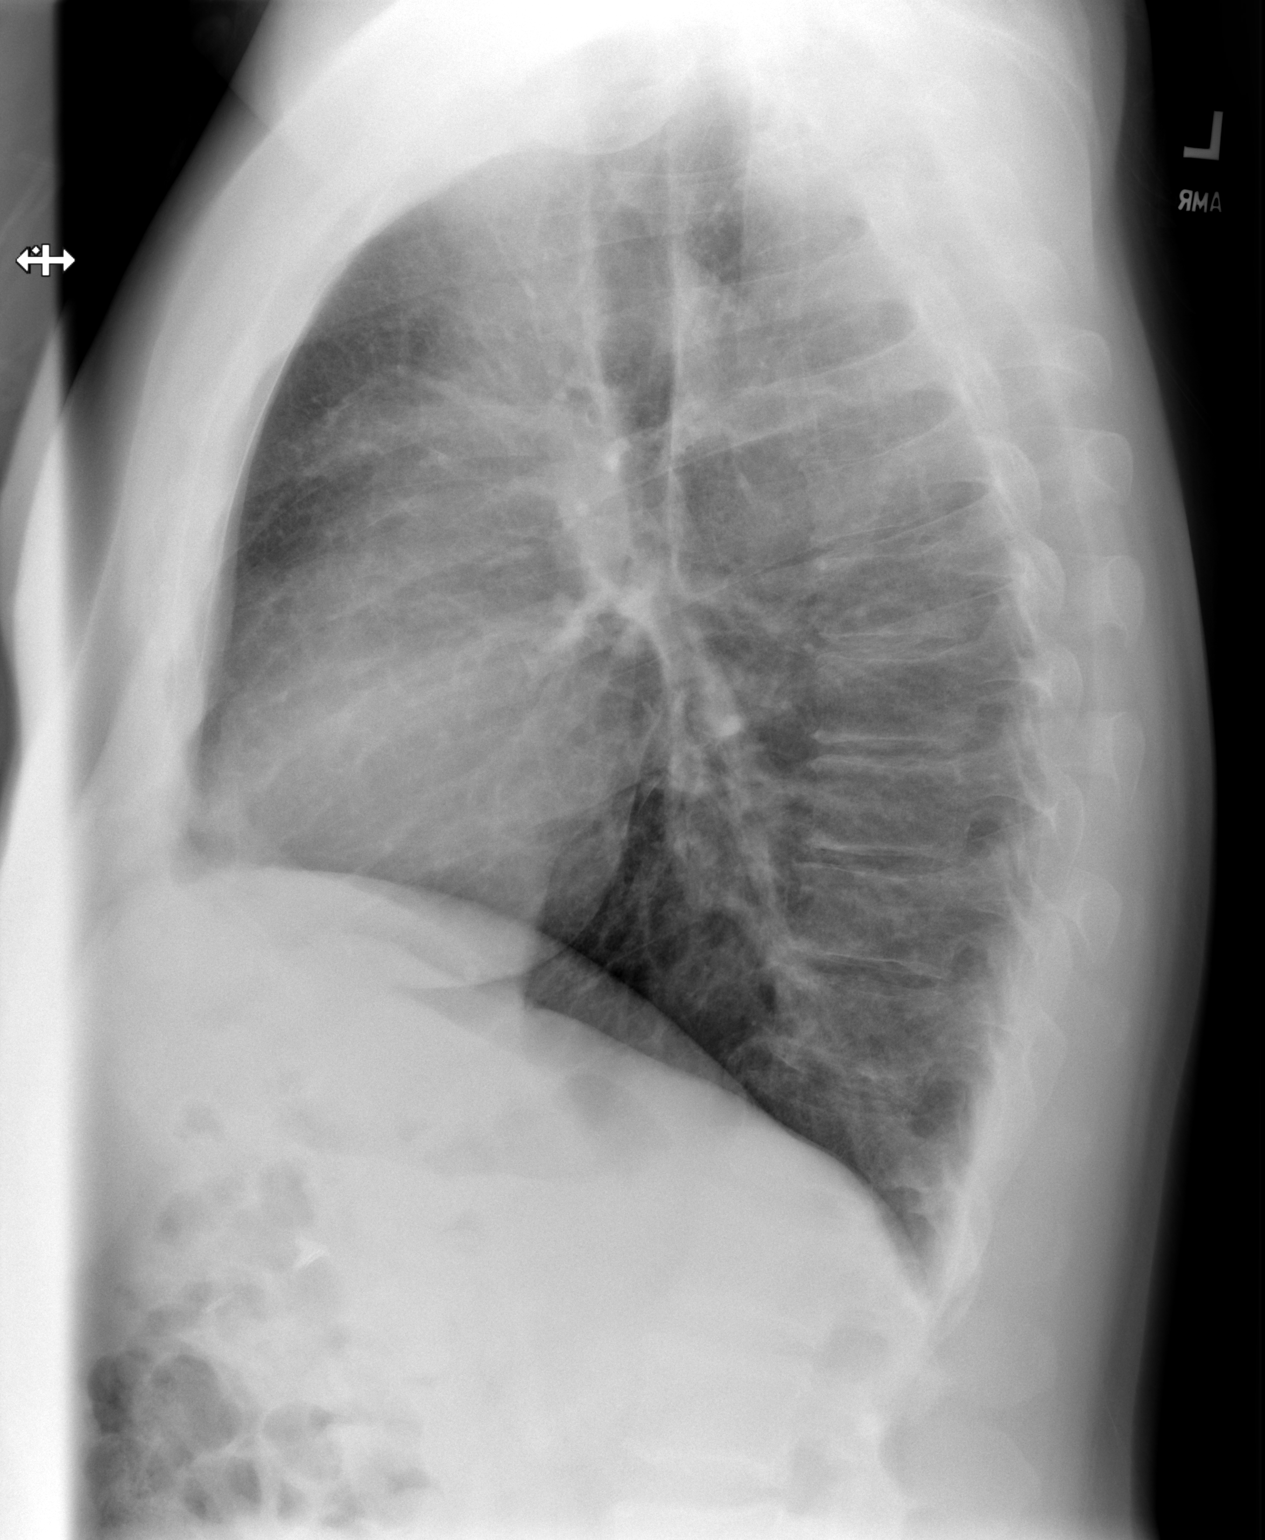

[2 of 2 positions shown; findings below may reference images not displayed]

FINDINGS: The cardiomediastinal silhouette is unchanged with normal heart
size. The lungs remain hyperinflated with mild chronic peribronchial
thickening. No confluent airspace opacity, edema, pleural effusion,
or pneumothorax is identified. No acute osseous abnormality is seen.
IMPRESSION: COPD without evidence of active cardiopulmonary disease.

## 2022-02-03 DIAGNOSIS — I10 Essential (primary) hypertension: Secondary | ICD-10-CM | POA: Diagnosis not present

## 2022-02-03 DIAGNOSIS — Z6829 Body mass index (BMI) 29.0-29.9, adult: Secondary | ICD-10-CM | POA: Diagnosis not present

## 2022-02-03 DIAGNOSIS — M5412 Radiculopathy, cervical region: Secondary | ICD-10-CM | POA: Diagnosis not present

## 2022-02-03 DIAGNOSIS — E6609 Other obesity due to excess calories: Secondary | ICD-10-CM | POA: Diagnosis not present

## 2022-02-03 DIAGNOSIS — M159 Polyosteoarthritis, unspecified: Secondary | ICD-10-CM | POA: Diagnosis not present

## 2022-02-03 DIAGNOSIS — G894 Chronic pain syndrome: Secondary | ICD-10-CM | POA: Diagnosis not present

## 2022-03-03 DIAGNOSIS — M1991 Primary osteoarthritis, unspecified site: Secondary | ICD-10-CM | POA: Diagnosis not present

## 2022-03-03 DIAGNOSIS — Z6829 Body mass index (BMI) 29.0-29.9, adult: Secondary | ICD-10-CM | POA: Diagnosis not present

## 2022-03-03 DIAGNOSIS — I10 Essential (primary) hypertension: Secondary | ICD-10-CM | POA: Diagnosis not present

## 2022-03-03 DIAGNOSIS — H6590 Unspecified nonsuppurative otitis media, unspecified ear: Secondary | ICD-10-CM | POA: Diagnosis not present

## 2022-03-03 DIAGNOSIS — E6609 Other obesity due to excess calories: Secondary | ICD-10-CM | POA: Diagnosis not present

## 2022-03-03 DIAGNOSIS — J449 Chronic obstructive pulmonary disease, unspecified: Secondary | ICD-10-CM | POA: Diagnosis not present

## 2022-03-03 DIAGNOSIS — G894 Chronic pain syndrome: Secondary | ICD-10-CM | POA: Diagnosis not present

## 2022-03-12 DIAGNOSIS — Z6829 Body mass index (BMI) 29.0-29.9, adult: Secondary | ICD-10-CM | POA: Diagnosis not present

## 2022-03-12 DIAGNOSIS — E2740 Unspecified adrenocortical insufficiency: Secondary | ICD-10-CM | POA: Diagnosis not present

## 2022-03-12 DIAGNOSIS — E663 Overweight: Secondary | ICD-10-CM | POA: Diagnosis not present

## 2022-04-08 DIAGNOSIS — M545 Low back pain, unspecified: Secondary | ICD-10-CM | POA: Diagnosis not present

## 2022-04-08 DIAGNOSIS — G894 Chronic pain syndrome: Secondary | ICD-10-CM | POA: Diagnosis not present

## 2022-04-08 DIAGNOSIS — M159 Polyosteoarthritis, unspecified: Secondary | ICD-10-CM | POA: Diagnosis not present

## 2022-04-08 DIAGNOSIS — E663 Overweight: Secondary | ICD-10-CM | POA: Diagnosis not present

## 2022-04-08 DIAGNOSIS — Z6829 Body mass index (BMI) 29.0-29.9, adult: Secondary | ICD-10-CM | POA: Diagnosis not present

## 2022-04-08 DIAGNOSIS — E2740 Unspecified adrenocortical insufficiency: Secondary | ICD-10-CM | POA: Diagnosis not present

## 2022-04-08 DIAGNOSIS — L309 Dermatitis, unspecified: Secondary | ICD-10-CM | POA: Diagnosis not present

## 2022-05-21 DIAGNOSIS — M159 Polyosteoarthritis, unspecified: Secondary | ICD-10-CM | POA: Diagnosis not present

## 2022-05-21 DIAGNOSIS — J449 Chronic obstructive pulmonary disease, unspecified: Secondary | ICD-10-CM | POA: Diagnosis not present

## 2022-05-21 DIAGNOSIS — M5412 Radiculopathy, cervical region: Secondary | ICD-10-CM | POA: Diagnosis not present

## 2022-05-21 DIAGNOSIS — I10 Essential (primary) hypertension: Secondary | ICD-10-CM | POA: Diagnosis not present

## 2022-05-21 DIAGNOSIS — G894 Chronic pain syndrome: Secondary | ICD-10-CM | POA: Diagnosis not present

## 2022-05-21 DIAGNOSIS — E2740 Unspecified adrenocortical insufficiency: Secondary | ICD-10-CM | POA: Diagnosis not present

## 2022-05-21 DIAGNOSIS — Z6829 Body mass index (BMI) 29.0-29.9, adult: Secondary | ICD-10-CM | POA: Diagnosis not present

## 2022-07-16 DIAGNOSIS — J449 Chronic obstructive pulmonary disease, unspecified: Secondary | ICD-10-CM | POA: Diagnosis not present

## 2022-07-16 DIAGNOSIS — Z6829 Body mass index (BMI) 29.0-29.9, adult: Secondary | ICD-10-CM | POA: Diagnosis not present

## 2022-07-16 DIAGNOSIS — M159 Polyosteoarthritis, unspecified: Secondary | ICD-10-CM | POA: Diagnosis not present

## 2022-07-16 DIAGNOSIS — I10 Essential (primary) hypertension: Secondary | ICD-10-CM | POA: Diagnosis not present

## 2022-07-16 DIAGNOSIS — M5412 Radiculopathy, cervical region: Secondary | ICD-10-CM | POA: Diagnosis not present

## 2022-07-16 DIAGNOSIS — M1991 Primary osteoarthritis, unspecified site: Secondary | ICD-10-CM | POA: Diagnosis not present

## 2022-09-10 DIAGNOSIS — J449 Chronic obstructive pulmonary disease, unspecified: Secondary | ICD-10-CM | POA: Diagnosis not present

## 2022-09-10 DIAGNOSIS — Z6828 Body mass index (BMI) 28.0-28.9, adult: Secondary | ICD-10-CM | POA: Diagnosis not present

## 2022-09-10 DIAGNOSIS — M5412 Radiculopathy, cervical region: Secondary | ICD-10-CM | POA: Diagnosis not present

## 2022-09-10 DIAGNOSIS — G894 Chronic pain syndrome: Secondary | ICD-10-CM | POA: Diagnosis not present

## 2022-09-10 DIAGNOSIS — I10 Essential (primary) hypertension: Secondary | ICD-10-CM | POA: Diagnosis not present

## 2022-09-10 DIAGNOSIS — M159 Polyosteoarthritis, unspecified: Secondary | ICD-10-CM | POA: Diagnosis not present

## 2022-10-23 DIAGNOSIS — J449 Chronic obstructive pulmonary disease, unspecified: Secondary | ICD-10-CM | POA: Diagnosis not present

## 2022-10-23 DIAGNOSIS — G894 Chronic pain syndrome: Secondary | ICD-10-CM | POA: Diagnosis not present

## 2022-10-23 DIAGNOSIS — M5412 Radiculopathy, cervical region: Secondary | ICD-10-CM | POA: Diagnosis not present

## 2022-10-23 DIAGNOSIS — J209 Acute bronchitis, unspecified: Secondary | ICD-10-CM | POA: Diagnosis not present

## 2022-10-23 DIAGNOSIS — Z6828 Body mass index (BMI) 28.0-28.9, adult: Secondary | ICD-10-CM | POA: Diagnosis not present

## 2022-10-23 DIAGNOSIS — M159 Polyosteoarthritis, unspecified: Secondary | ICD-10-CM | POA: Diagnosis not present

## 2022-10-23 DIAGNOSIS — I10 Essential (primary) hypertension: Secondary | ICD-10-CM | POA: Diagnosis not present

## 2022-10-23 DIAGNOSIS — J0100 Acute maxillary sinusitis, unspecified: Secondary | ICD-10-CM | POA: Diagnosis not present

## 2022-10-26 ENCOUNTER — Ambulatory Visit (INDEPENDENT_AMBULATORY_CARE_PROVIDER_SITE_OTHER): Payer: Medicare Other | Admitting: Internal Medicine

## 2022-10-26 ENCOUNTER — Encounter: Payer: Self-pay | Admitting: Internal Medicine

## 2022-10-26 VITALS — BP 132/76 | HR 88 | Temp 98.0°F | Ht 70.0 in | Wt 195.2 lb

## 2022-10-26 DIAGNOSIS — J449 Chronic obstructive pulmonary disease, unspecified: Secondary | ICD-10-CM | POA: Diagnosis not present

## 2022-10-26 NOTE — Progress Notes (Signed)
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 Pt states started having SOB July 2020- advair had helped in the past but ran out approx 2 months ago. He uses albuterol 2-3 x per day- has issues with breathing when the temp changes and with exertion.  Dyspnea:  MMRC2 = can't walk a nl pace on a flat grade s sob but does fine slow and flat  Cough: none  Sleep: no problem blocks < 30 degrees  SABA use:  Several times a dat saba mostly with exertion  Rec Plan A = Automatic = Always=   Breztri Take 2 puffs first thing in am and then another 2 puffs about 12 hours later.  Work on inhaler technique:    Plan B = Backup (to supplement plan A, not to replace it) Only use your albuterol (proair) inhaler as a rescue medication  I strongly recommend the moderna or pfizer vaccine especially if you hear about the delta variant in our area Please schedule a follow up visit in 3 months but call sooner if needed     10/21/2021  f/u ov/[place] office/Ibtisam Benge re: copd GOLD ? Still smoking  maint on symbi2bid   Chief Complaint  Patient presents with   Follow-up    Sob and cough are the same since last OV. Had a cold a month ago and feels like he's been sick since. Coughing up yellow mucus,    Dyspnea:  walks dog x 30 min ok flat ok/ main problem is hills  Cough: worse in am / yellow  Sleeping: fine board under mattress  SABA use:  uses with activity (not the instructions  he was given, only uses after ex 02: none  Covid status: vax never/ never infected  Lung cancer screening: referred    Rec We will refer you to our lung cancer screening  program  Change protonix 40 mg Take 30- 60 min before your first and last meals of the day to see if helps cough  Zpak  The key is to stop smoking completely before smoking completely stops you!  Plan A = Automatic = Always=    Bevespi Take 2 puffs first thing in am and then another 2 puffs about 12 hours later.   Plan B = Backup (to supplement plan A, not to replace it) Only use your albuterol inhaler as a rescue medication  Ok to try albuterol 15 min before an activity (on alternating days)  that you know would usually make you short of breath> did not not We will schedule you for PFT's next available at [facility]> refused due to request for covid testing prior    10/26/2022  yearly f/u ov/[place] office/Abdimalik Mayorquin re: copd ? Gold / clinically group Bmaint on Bevespi  - did not do LCST as rec  Chief Complaint  Patient presents with  Follow-up    Breathing has improved since last ov but patient has had chest congestion since having flu in Oct. Currently taking z pak and prednisone    Dyspnea:  walking a mile a day with hills and takes the saba p hill once a week at most but never pre challenges as rec   Cp once a week on hills already eval by cards (Dr Raliegh Ip) and same pattern now  Cough:better since flu in oct 2023  Sleeping: on 1.5 in blocks/ one pillow  SABA use: once every several weeks, helps the chest discomfort  02: none  Covid status: none/ never infected  Lung cancer screening: declines    No obvious day to day or daytime variability or assoc excess/ purulent sputum or mucus plugs or hemoptysis or cp or chest tightness, subjective wheeze or overt sinus or hb symptoms.   Sleeping  without nocturnal  or early am exacerbation  of respiratory  c/o's or need for noct saba. Also denies any obvious fluctuation of symptoms  with weather or environmental changes or other aggravating or alleviating factors except as outlined above   No unusual exposure hx or h/o childhood pna/ asthma or knowledge of premature birth.  Current Allergies, Complete Past Medical History, Past Surgical History, Family History, and Social History were reviewed in [employer] record.  ROS  The following are not active complaints unless bolded Hoarseness, sore throat, dysphagia, dental problems, itching, sneezing,  nasal congestion or discharge of excess mucus or purulent secretions, ear ache,   fever, chills, sweats, unintended wt loss or wt gain, classically pleuritic or exertional cp,  orthopnea pnd or arm/hand swelling  or leg swelling, presyncope, palpitations, abdominal pain, anorexia, nausea, vomiting, diarrhea  or change in bowel habits or change in bladder habits, change in stools or change in urine, dysuria, hematuria,  rash, arthralgias, visual complaints, headache, numbness, weakness or ataxia or problems with walking or coordination,  change in mood or  memory.        Current Meds  Medication Sig   amLODipine (NORVASC) 5 MG tablet Take 5 mg by mouth daily.   azithromycin (ZITHROMAX) 250 MG tablet Take 2 on day one then 1 daily x 4 days   Black Pepper-Turmeric (TURMERIC CURCUMIN) 04-999 MG CAPS Take 1 capsule by mouth daily.   Coenzyme Q10 (COQ-10) 100 MG CAPS Take 1 capsule by mouth daily.   Glycopyrrolate-Formoterol (BEVESPI AEROSPHERE) 9-4.8 MCG/ACT AERO Take 2 puffs first thing in am and then another 2 puffs about 12 hours later.   oxyCODONE (OXYCONTIN) 10 mg 12 hr tablet Take 15 mg by mouth daily. pain   pantoprazole (PROTONIX) 40 MG tablet Take 40 mg by mouth 2 (two) times daily.    predniSONE (DELTASONE) 10 MG tablet Take 10 mg by mouth daily.   tiZANidine (ZANAFLEX) 4 MG capsule Take 4 mg by mouth at bedtime.   traZODone (DESYREL) 100 MG tablet Take 150 mg by mouth at bedtime.                 Past Medical History:  Diagnosis Date   Arthritis    Rheumatoid   Bulging disc    Complication of anesthesia    pt states that he had a lot of back pain after spinal with hernia repair   GERD (gastroesophageal reflux disease)    Hypertension    Shortness of breath    Sleep apnea    STOP BANG score = 4  Objective:      Wts                195  10/21/21 199 lb 1.3 oz (90.3 kg)  06/20/20 215 lb (97.5 kg)  03/07/20 231 lb (104.8 kg)    Vital signs reviewed  10/26/2022  - Note at rest 02 sats  98% on RA   General appearance:    amb wm nad      HEENT : Oropharynx  clear   Nasal turbinates nl    NECK :  without  apparent JVD/ palpable Nodes/TM    LUNGS: no acc muscle use,  Min barrel  contour chest wall with bilateral  slightly decreased bs s audible wheeze and  without cough on insp or exp maneuvers and min  Hyperresonant  to  percussion bilaterally    CV:  RRR  no s3 or murmur or increase in P2, and no edema   ABD:  soft and nontender with pos end  insp Hoover's  in the supine position.  No bruits or organomegaly appreciated   MS:  Nl gait/ ext warm without deformities Or obvious joint restrictions  calf tenderness, cyanosis or clubbing     SKIN: warm and dry without lesions    NEURO:  alert, approp, nl sensorium with  no motor or cerebellar deficits apparent.             Assessment

## 2022-10-26 NOTE — Patient Instructions (Addendum)
The key is to stop smoking completely before smoking completely stops you!  I strongly recommend the lung cancer screening yearly now and for 15 years    Try albuterol 15 min before an activity (on alternating days)  that you know would usually make you short of breath (walking up hills) and see if it makes any difference and if makes none then don't take albuterol after activity unless you can't catch your breath as this means it's the resting that helps, not the albuterol.      Pulmonary follow-up is as needed

## 2022-10-26 NOTE — Assessment & Plan Note (Signed)
Active smoker  - 06/20/2020   start breztri   - 10/26/2022  After extensive coaching inhaler device,  effectiveness =  90% continue bevespi   Other than mastering good hfa technique, pt has not followed a single rec I made and no need to return to pulmonary clinic in this setting  1) work on stop smoking 2) approp use of saba Re SABA :  I spent extra time with pt today reviewing appropriate use of albuterol for prn use on exertion with the following points: A) saba is for relief of sob that does not improve by walking a slower pace or resting but rather if the pt does not improve after trying this first. B) If the pt is convinced, as many are, that saba helps recover from activity faster then it's easy to tell if this is the case by re-challenging : ie stop, take the inhaler, then p 5 minutes try the exact same activity (intensity of workload) that just caused the symptoms and see if they are substantially diminished or not after saba C) if there is an activity that reproducibly causes the symptoms, try the saba 15 min before the activity on alternate days   D)  >>>If in fact the saba really does help, then fine to continue to use it prn but advised may need to look closer at the maintenance regimen being used to achieve better control of airways disease with exertion.   3) yearly low dose sreening pfts   4) Covid vax  5) pfts which simply require nasal swab screen prior    Pulmonary f/u is therefore prn

## 2022-11-25 ENCOUNTER — Other Ambulatory Visit: Payer: Self-pay | Admitting: Internal Medicine

## 2022-12-02 DIAGNOSIS — J449 Chronic obstructive pulmonary disease, unspecified: Secondary | ICD-10-CM | POA: Diagnosis not present

## 2022-12-02 DIAGNOSIS — I10 Essential (primary) hypertension: Secondary | ICD-10-CM | POA: Diagnosis not present

## 2022-12-02 DIAGNOSIS — M159 Polyosteoarthritis, unspecified: Secondary | ICD-10-CM | POA: Diagnosis not present

## 2022-12-02 DIAGNOSIS — M5412 Radiculopathy, cervical region: Secondary | ICD-10-CM | POA: Diagnosis not present

## 2022-12-02 DIAGNOSIS — G894 Chronic pain syndrome: Secondary | ICD-10-CM | POA: Diagnosis not present

## 2022-12-02 DIAGNOSIS — E663 Overweight: Secondary | ICD-10-CM | POA: Diagnosis not present

## 2022-12-02 DIAGNOSIS — Z6828 Body mass index (BMI) 28.0-28.9, adult: Secondary | ICD-10-CM | POA: Diagnosis not present

## 2023-02-26 DIAGNOSIS — G894 Chronic pain syndrome: Secondary | ICD-10-CM | POA: Diagnosis not present

## 2023-02-26 DIAGNOSIS — I10 Essential (primary) hypertension: Secondary | ICD-10-CM | POA: Diagnosis not present

## 2023-02-26 DIAGNOSIS — M5412 Radiculopathy, cervical region: Secondary | ICD-10-CM | POA: Diagnosis not present

## 2023-02-26 DIAGNOSIS — Z6828 Body mass index (BMI) 28.0-28.9, adult: Secondary | ICD-10-CM | POA: Diagnosis not present

## 2023-02-26 DIAGNOSIS — J449 Chronic obstructive pulmonary disease, unspecified: Secondary | ICD-10-CM | POA: Diagnosis not present

## 2023-02-26 DIAGNOSIS — J209 Acute bronchitis, unspecified: Secondary | ICD-10-CM | POA: Diagnosis not present

## 2023-05-05 DIAGNOSIS — M159 Polyosteoarthritis, unspecified: Secondary | ICD-10-CM | POA: Diagnosis not present

## 2023-05-05 DIAGNOSIS — Z6827 Body mass index (BMI) 27.0-27.9, adult: Secondary | ICD-10-CM | POA: Diagnosis not present

## 2023-05-05 DIAGNOSIS — M1991 Primary osteoarthritis, unspecified site: Secondary | ICD-10-CM | POA: Diagnosis not present

## 2023-05-05 DIAGNOSIS — M5412 Radiculopathy, cervical region: Secondary | ICD-10-CM | POA: Diagnosis not present

## 2023-05-05 DIAGNOSIS — J449 Chronic obstructive pulmonary disease, unspecified: Secondary | ICD-10-CM | POA: Diagnosis not present

## 2023-05-05 DIAGNOSIS — I10 Essential (primary) hypertension: Secondary | ICD-10-CM | POA: Diagnosis not present

## 2023-05-05 DIAGNOSIS — G894 Chronic pain syndrome: Secondary | ICD-10-CM | POA: Diagnosis not present

## 2023-05-05 DIAGNOSIS — E663 Overweight: Secondary | ICD-10-CM | POA: Diagnosis not present

## 2023-06-23 DIAGNOSIS — H6120 Impacted cerumen, unspecified ear: Secondary | ICD-10-CM | POA: Diagnosis not present

## 2023-06-23 DIAGNOSIS — I10 Essential (primary) hypertension: Secondary | ICD-10-CM | POA: Diagnosis not present

## 2023-06-23 DIAGNOSIS — Z6828 Body mass index (BMI) 28.0-28.9, adult: Secondary | ICD-10-CM | POA: Diagnosis not present

## 2023-06-23 DIAGNOSIS — J449 Chronic obstructive pulmonary disease, unspecified: Secondary | ICD-10-CM | POA: Diagnosis not present

## 2023-06-23 DIAGNOSIS — G894 Chronic pain syndrome: Secondary | ICD-10-CM | POA: Diagnosis not present

## 2023-06-23 DIAGNOSIS — E663 Overweight: Secondary | ICD-10-CM | POA: Diagnosis not present

## 2023-06-23 DIAGNOSIS — M5412 Radiculopathy, cervical region: Secondary | ICD-10-CM | POA: Diagnosis not present

## 2023-09-23 DIAGNOSIS — I10 Essential (primary) hypertension: Secondary | ICD-10-CM | POA: Diagnosis not present

## 2023-09-23 DIAGNOSIS — Z6827 Body mass index (BMI) 27.0-27.9, adult: Secondary | ICD-10-CM | POA: Diagnosis not present

## 2023-09-23 DIAGNOSIS — J209 Acute bronchitis, unspecified: Secondary | ICD-10-CM | POA: Diagnosis not present

## 2023-09-23 DIAGNOSIS — J449 Chronic obstructive pulmonary disease, unspecified: Secondary | ICD-10-CM | POA: Diagnosis not present

## 2023-09-23 DIAGNOSIS — G894 Chronic pain syndrome: Secondary | ICD-10-CM | POA: Diagnosis not present

## 2023-09-23 DIAGNOSIS — E663 Overweight: Secondary | ICD-10-CM | POA: Diagnosis not present

## 2023-09-23 DIAGNOSIS — M159 Polyosteoarthritis, unspecified: Secondary | ICD-10-CM | POA: Diagnosis not present

## 2023-09-23 DIAGNOSIS — E2740 Unspecified adrenocortical insufficiency: Secondary | ICD-10-CM | POA: Diagnosis not present

## 2023-09-23 DIAGNOSIS — M5412 Radiculopathy, cervical region: Secondary | ICD-10-CM | POA: Diagnosis not present

## 2023-09-23 DIAGNOSIS — Z23 Encounter for immunization: Secondary | ICD-10-CM | POA: Diagnosis not present

## 2023-11-01 DIAGNOSIS — Z6828 Body mass index (BMI) 28.0-28.9, adult: Secondary | ICD-10-CM | POA: Diagnosis not present

## 2023-11-01 DIAGNOSIS — G894 Chronic pain syndrome: Secondary | ICD-10-CM | POA: Diagnosis not present

## 2023-11-01 DIAGNOSIS — I10 Essential (primary) hypertension: Secondary | ICD-10-CM | POA: Diagnosis not present

## 2023-11-01 DIAGNOSIS — M159 Polyosteoarthritis, unspecified: Secondary | ICD-10-CM | POA: Diagnosis not present

## 2023-11-01 DIAGNOSIS — E2740 Unspecified adrenocortical insufficiency: Secondary | ICD-10-CM | POA: Diagnosis not present

## 2023-11-01 DIAGNOSIS — M5412 Radiculopathy, cervical region: Secondary | ICD-10-CM | POA: Diagnosis not present

## 2023-11-01 DIAGNOSIS — E663 Overweight: Secondary | ICD-10-CM | POA: Diagnosis not present

## 2023-11-01 DIAGNOSIS — J449 Chronic obstructive pulmonary disease, unspecified: Secondary | ICD-10-CM | POA: Diagnosis not present

## 2023-11-29 ENCOUNTER — Other Ambulatory Visit: Payer: Self-pay | Admitting: Internal Medicine

## 2023-11-30 DIAGNOSIS — J0100 Acute maxillary sinusitis, unspecified: Secondary | ICD-10-CM | POA: Diagnosis not present

## 2023-11-30 DIAGNOSIS — M159 Polyosteoarthritis, unspecified: Secondary | ICD-10-CM | POA: Diagnosis not present

## 2023-11-30 DIAGNOSIS — Z6828 Body mass index (BMI) 28.0-28.9, adult: Secondary | ICD-10-CM | POA: Diagnosis not present

## 2023-11-30 DIAGNOSIS — J209 Acute bronchitis, unspecified: Secondary | ICD-10-CM | POA: Diagnosis not present

## 2023-11-30 DIAGNOSIS — M5412 Radiculopathy, cervical region: Secondary | ICD-10-CM | POA: Diagnosis not present

## 2023-11-30 DIAGNOSIS — E663 Overweight: Secondary | ICD-10-CM | POA: Diagnosis not present

## 2023-11-30 DIAGNOSIS — J449 Chronic obstructive pulmonary disease, unspecified: Secondary | ICD-10-CM | POA: Diagnosis not present

## 2023-11-30 DIAGNOSIS — I10 Essential (primary) hypertension: Secondary | ICD-10-CM | POA: Diagnosis not present

## 2023-11-30 DIAGNOSIS — G894 Chronic pain syndrome: Secondary | ICD-10-CM | POA: Diagnosis not present

## 2023-12-31 DIAGNOSIS — I10 Essential (primary) hypertension: Secondary | ICD-10-CM | POA: Diagnosis not present

## 2023-12-31 DIAGNOSIS — J449 Chronic obstructive pulmonary disease, unspecified: Secondary | ICD-10-CM | POA: Diagnosis not present

## 2023-12-31 DIAGNOSIS — Z6829 Body mass index (BMI) 29.0-29.9, adult: Secondary | ICD-10-CM | POA: Diagnosis not present

## 2023-12-31 DIAGNOSIS — M5412 Radiculopathy, cervical region: Secondary | ICD-10-CM | POA: Diagnosis not present

## 2023-12-31 DIAGNOSIS — G894 Chronic pain syndrome: Secondary | ICD-10-CM | POA: Diagnosis not present

## 2023-12-31 DIAGNOSIS — H608X2 Other otitis externa, left ear: Secondary | ICD-10-CM | POA: Diagnosis not present

## 2023-12-31 DIAGNOSIS — M159 Polyosteoarthritis, unspecified: Secondary | ICD-10-CM | POA: Diagnosis not present

## 2024-02-03 DIAGNOSIS — J449 Chronic obstructive pulmonary disease, unspecified: Secondary | ICD-10-CM | POA: Diagnosis not present

## 2024-02-03 DIAGNOSIS — G894 Chronic pain syndrome: Secondary | ICD-10-CM | POA: Diagnosis not present

## 2024-02-03 DIAGNOSIS — M5412 Radiculopathy, cervical region: Secondary | ICD-10-CM | POA: Diagnosis not present

## 2024-02-03 DIAGNOSIS — M159 Polyosteoarthritis, unspecified: Secondary | ICD-10-CM | POA: Diagnosis not present

## 2024-02-03 DIAGNOSIS — I10 Essential (primary) hypertension: Secondary | ICD-10-CM | POA: Diagnosis not present

## 2024-02-03 DIAGNOSIS — Z6829 Body mass index (BMI) 29.0-29.9, adult: Secondary | ICD-10-CM | POA: Diagnosis not present

## 2024-02-03 DIAGNOSIS — E6609 Other obesity due to excess calories: Secondary | ICD-10-CM | POA: Diagnosis not present

## 2024-04-05 DIAGNOSIS — J209 Acute bronchitis, unspecified: Secondary | ICD-10-CM | POA: Diagnosis not present

## 2024-04-05 DIAGNOSIS — G894 Chronic pain syndrome: Secondary | ICD-10-CM | POA: Diagnosis not present

## 2024-04-05 DIAGNOSIS — J449 Chronic obstructive pulmonary disease, unspecified: Secondary | ICD-10-CM | POA: Diagnosis not present

## 2024-04-05 DIAGNOSIS — J0100 Acute maxillary sinusitis, unspecified: Secondary | ICD-10-CM | POA: Diagnosis not present

## 2024-04-05 DIAGNOSIS — J441 Chronic obstructive pulmonary disease with (acute) exacerbation: Secondary | ICD-10-CM | POA: Diagnosis not present

## 2024-04-05 DIAGNOSIS — M5412 Radiculopathy, cervical region: Secondary | ICD-10-CM | POA: Diagnosis not present

## 2024-04-05 DIAGNOSIS — Z6829 Body mass index (BMI) 29.0-29.9, adult: Secondary | ICD-10-CM | POA: Diagnosis not present

## 2024-04-05 DIAGNOSIS — E663 Overweight: Secondary | ICD-10-CM | POA: Diagnosis not present

## 2024-04-05 DIAGNOSIS — I10 Essential (primary) hypertension: Secondary | ICD-10-CM | POA: Diagnosis not present

## 2024-05-05 DIAGNOSIS — J449 Chronic obstructive pulmonary disease, unspecified: Secondary | ICD-10-CM | POA: Diagnosis not present

## 2024-05-05 DIAGNOSIS — E2740 Unspecified adrenocortical insufficiency: Secondary | ICD-10-CM | POA: Diagnosis not present

## 2024-05-05 DIAGNOSIS — G894 Chronic pain syndrome: Secondary | ICD-10-CM | POA: Diagnosis not present

## 2024-05-05 DIAGNOSIS — Z0001 Encounter for general adult medical examination with abnormal findings: Secondary | ICD-10-CM | POA: Diagnosis not present

## 2024-05-05 DIAGNOSIS — M1991 Primary osteoarthritis, unspecified site: Secondary | ICD-10-CM | POA: Diagnosis not present

## 2024-05-05 DIAGNOSIS — Z9229 Personal history of other drug therapy: Secondary | ICD-10-CM | POA: Diagnosis not present

## 2024-05-05 DIAGNOSIS — I10 Essential (primary) hypertension: Secondary | ICD-10-CM | POA: Diagnosis not present

## 2024-05-05 DIAGNOSIS — M159 Polyosteoarthritis, unspecified: Secondary | ICD-10-CM | POA: Diagnosis not present

## 2024-05-05 DIAGNOSIS — R5383 Other fatigue: Secondary | ICD-10-CM | POA: Diagnosis not present

## 2024-05-05 DIAGNOSIS — M5412 Radiculopathy, cervical region: Secondary | ICD-10-CM | POA: Diagnosis not present

## 2024-05-10 ENCOUNTER — Encounter: Payer: Self-pay | Admitting: *Deleted

## 2024-06-13 DIAGNOSIS — M5412 Radiculopathy, cervical region: Secondary | ICD-10-CM | POA: Diagnosis not present

## 2024-06-13 DIAGNOSIS — M159 Polyosteoarthritis, unspecified: Secondary | ICD-10-CM | POA: Diagnosis not present

## 2024-06-13 DIAGNOSIS — E2740 Unspecified adrenocortical insufficiency: Secondary | ICD-10-CM | POA: Diagnosis not present

## 2024-06-13 DIAGNOSIS — Z91014 Allergy to mammalian meats: Secondary | ICD-10-CM | POA: Diagnosis not present

## 2024-06-13 DIAGNOSIS — J449 Chronic obstructive pulmonary disease, unspecified: Secondary | ICD-10-CM | POA: Diagnosis not present

## 2024-06-13 DIAGNOSIS — I10 Essential (primary) hypertension: Secondary | ICD-10-CM | POA: Diagnosis not present

## 2024-06-13 DIAGNOSIS — G894 Chronic pain syndrome: Secondary | ICD-10-CM | POA: Diagnosis not present

## 2024-06-13 DIAGNOSIS — Z6828 Body mass index (BMI) 28.0-28.9, adult: Secondary | ICD-10-CM | POA: Diagnosis not present

## 2024-06-13 DIAGNOSIS — E663 Overweight: Secondary | ICD-10-CM | POA: Diagnosis not present

## 2024-07-13 DIAGNOSIS — J449 Chronic obstructive pulmonary disease, unspecified: Secondary | ICD-10-CM | POA: Diagnosis not present

## 2024-07-13 DIAGNOSIS — E2740 Unspecified adrenocortical insufficiency: Secondary | ICD-10-CM | POA: Diagnosis not present

## 2024-07-13 DIAGNOSIS — M5412 Radiculopathy, cervical region: Secondary | ICD-10-CM | POA: Diagnosis not present

## 2024-07-13 DIAGNOSIS — Z6828 Body mass index (BMI) 28.0-28.9, adult: Secondary | ICD-10-CM | POA: Diagnosis not present

## 2024-07-13 DIAGNOSIS — M159 Polyosteoarthritis, unspecified: Secondary | ICD-10-CM | POA: Diagnosis not present

## 2024-07-13 DIAGNOSIS — I10 Essential (primary) hypertension: Secondary | ICD-10-CM | POA: Diagnosis not present

## 2024-07-13 DIAGNOSIS — R5383 Other fatigue: Secondary | ICD-10-CM | POA: Diagnosis not present

## 2024-07-13 DIAGNOSIS — R400 Somnolence: Secondary | ICD-10-CM | POA: Diagnosis not present

## 2024-07-13 DIAGNOSIS — E663 Overweight: Secondary | ICD-10-CM | POA: Diagnosis not present

## 2024-08-09 DIAGNOSIS — G4730 Sleep apnea, unspecified: Secondary | ICD-10-CM | POA: Diagnosis not present

## 2024-08-18 DIAGNOSIS — J449 Chronic obstructive pulmonary disease, unspecified: Secondary | ICD-10-CM | POA: Diagnosis not present

## 2024-08-18 DIAGNOSIS — M5412 Radiculopathy, cervical region: Secondary | ICD-10-CM | POA: Diagnosis not present

## 2024-08-18 DIAGNOSIS — M159 Polyosteoarthritis, unspecified: Secondary | ICD-10-CM | POA: Diagnosis not present

## 2024-08-18 DIAGNOSIS — G894 Chronic pain syndrome: Secondary | ICD-10-CM | POA: Diagnosis not present

## 2024-08-18 DIAGNOSIS — I10 Essential (primary) hypertension: Secondary | ICD-10-CM | POA: Diagnosis not present

## 2024-09-11 ENCOUNTER — Ambulatory Visit: Payer: Self-pay

## 2024-09-11 NOTE — Telephone Encounter (Signed)
 FYI Only or Action Required?: FYI only for provider.  Patient was last seen in primary care on - Patient is not established.  Called Nurse Triage reporting Insect Bite.  Symptoms began a week ago.  Interventions attempted: Nothing.  Symptoms are: stable.  Triage Disposition: Information or Advice Only Call  Patient/caregiver understands and will follow disposition?: Yes    Copied from CRM [id number]. Topic: Clinical - Red Word Triage >> Sep 11, 2024 12:07 PM Wess RAMAN wrote: Red Word that prompted transfer to Nurse Triage: [name] on the hand by a bee on Monday, 09/04/24. Has Alpha-gal syndrome Reason for Disposition  Health information question, no triage required and triager able to answer question  Answer Assessment - Initial Assessment Questions PAS transferred to patient to Nurse Triage since patient reported he has Alpha-gal syndrome and had a bee sting on Monday 9/15. Patient states he has no lingering symptoms and denies any flare up or shortness of breath. Patient will call back with any concerns.  Protocols used: Information Only Call - No Triage-A-AH

## 2024-09-12 ENCOUNTER — Ambulatory Visit: Admitting: Nurse Practitioner

## 2024-10-02 ENCOUNTER — Encounter: Admitting: Student in an Organized Health Care Education/Training Program

## 2024-10-06 ENCOUNTER — Ambulatory Visit: Admitting: Student in an Organized Health Care Education/Training Program

## 2024-10-06 ENCOUNTER — Encounter: Payer: Self-pay | Admitting: Student in an Organized Health Care Education/Training Program

## 2024-10-06 VITALS — BP 135/69 | HR 76 | Ht 69.5 in | Wt 190.0 lb

## 2024-10-06 DIAGNOSIS — E271 Primary adrenocortical insufficiency: Secondary | ICD-10-CM

## 2024-10-06 DIAGNOSIS — Z23 Encounter for immunization: Secondary | ICD-10-CM | POA: Diagnosis not present

## 2024-10-06 DIAGNOSIS — Z91014 Allergy to mammalian meats: Secondary | ICD-10-CM | POA: Diagnosis not present

## 2024-10-06 DIAGNOSIS — E2740 Unspecified adrenocortical insufficiency: Secondary | ICD-10-CM | POA: Insufficient documentation

## 2024-10-06 DIAGNOSIS — G894 Chronic pain syndrome: Secondary | ICD-10-CM

## 2024-10-06 LAB — POCT URINE DRUG SCREEN
Methylenedioxyamphetamine: NOT DETECTED
POC Amphetamine UR: NOT DETECTED
POC BENZODIAZEPINES UR: NOT DETECTED
POC Barbiturate UR: NOT DETECTED
POC Cocaine UR: NOT DETECTED
POC Ecstasy UR: NOT DETECTED
POC Marijuana UR: NOT DETECTED
POC Methadone UR: NOT DETECTED
POC Methamphetamine UR: NOT DETECTED
POC Opiate Ur: NOT DETECTED
POC Oxycodone UR: POSITIVE — AB
POC PHENCYCLIDINE UR: NOT DETECTED
POC TRICYCLICS UR: NOT DETECTED
URINE TEMPERATURE: 98 [degF] (ref 90.0–100.0)

## 2024-10-06 MED ORDER — OXYCODONE HCL 15 MG PO TABS: 15.0000 mg | ORAL_TABLET | Freq: Four times a day (QID) | ORAL | 0 refills | Status: DC | PRN

## 2024-10-06 NOTE — Assessment & Plan Note (Signed)
 Need to give more history on this issue.  Reports currently being on prednisone  10 mg by mouth daily for adrenal insufficiency.  There is cortisol testing in epic from 2014 that showed a ACTH  stim test with a 60-minute cortisol level maxing at 15.  This is consistent with adrenal insufficiency.  Has been on prednisone  for many many years, and was told that he could never come off of it.  Will continue this for now.  Blood pressure stable.  I recommended blood work today, patient declined saying that it was recently done with Dr. Bertell.  Will try to obtain those records.

## 2024-10-06 NOTE — Assessment & Plan Note (Signed)
 Chronic issue.  Chronic pain generator is low back pain due to osteoarthritis of the lumbar spine status post cervical fusion.  He has had chronic opioid therapy for many years, on disability for the last 5 years.  I reviewed the controlled database which confirms that he was using oxycodone  20 mg about 6 times daily, previously getting 180 tablets for a 1 month supply.  2 months ago with Dr. Rogenia decreased to 150 tablets for a 1 month supply.  Patient not able to tell me why the change.  He denies any adverse side effects, no symptoms of opioid use disorder.  He reports good benefit to the medication, says it improved his functional status.  He is worried that the opioid is causing him increasing constipation and he is interested in a reduction in the dose to a 15 mg tablet.  I offered him referral to pain management, patient declined.  He has limited transportation and does not want to see more specialists.  Given his constipation, advancing age, frailty on exam, I recommended that we work the dose down to a max of 90 MME daily.  Oxycodone  equivalent dosing for this would be 15 mg 4 times daily.  Given the high doses of long time on the oxycodone  he is at risk for withdrawal.  Some going to decrease the dosing to oxycodone  15 mg with 150 tablets for a 1 month supply.  At follow-up visit we will decrease dose down to 120 tablets for 1 month supply as long as he is doing well.  Will check urine drug screen today.

## 2024-10-06 NOTE — Progress Notes (Signed)
 New Patient Office Visit  Subjective    Patient ID: Tony Meadows, male    DOB: 11-02-1966  Age: 58 y.o. MRN: [id number]  CC:  Chief Complaint  Patient presents with   Transitions Of Care    Dosage change for Oxy  Sore throat and alpha-gal  Colonoscopy- declined  Lung cancer screening- completed about 2 years ago [facility], declined for a new referral  Declined vaccines except flu     HPI  Discussed the use of AI scribe software for clinical note transcription with the patient, who gave verbal consent to proceed.  History of Present Illness Tony Meadows is a 58 year old male with alpha-gal syndrome and chronic pain who presents for management of his symptoms and medication review.  He has been experiencing symptoms related to alpha-gal syndrome for the past six to eight months, including chronic constipation, which is atypical for the condition. He also has a persistent sore throat, which he attributes to the syndrome. He avoids meat and primarily consumes chicken and fish. No hives have been experienced, but he had a significant reaction to a bee sting, managed with Benadryl .  He has a history of adrenal insufficiency and takes prednisone . For sleep, he uses trazodone. He has chronic back pain following an L4-L5 fusion surgery and has been on disability for five years. Initially, he used hydrocodone  but switched to oxycodone  due to kidney concerns. Oxycodone  helps with his pain, especially when used with creams, but he is concerned about its impact on his stomach and constipation.  He has rheumatoid arthritis but does not take specific medication for it, relying on pain pills. He experiences significant joint pain, particularly in his knees, elbows, and hands, affecting his daily activities. 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  He has experienced dry mouth for years, which he attributes to long-term use of reflux  medication. He also reports a burning sensation from head to toe, which was eventually managed with amitriptyline after ten years of searching for an effective treatment.    Outpatient Encounter Medications as of 10/06/2024  Medication Sig   amitriptyline (ELAVIL) 25 MG tablet Take by mouth.   atorvastatin (LIPITOR) 80 MG tablet Take 80 mg by mouth daily.   oxyCODONE  (ROXICODONE ) 15 MG immediate release tablet Take 1 tablet (15 mg total) by mouth every 6 (six) hours as needed for pain.   pantoprazole (PROTONIX) 40 MG tablet Take 40 mg by mouth 2 (two) times daily.    predniSONE  (DELTASONE ) 10 MG tablet Take 10 mg by mouth daily.   traZODone (DESYREL) 100 MG tablet Take 150 mg by mouth at bedtime.   [DISCONTINUED] oxyCODONE  (OXYCONTIN ) 10 mg 12 hr tablet Take 15 mg by mouth daily. pain   [DISCONTINUED] amLODipine (NORVASC) 5 MG tablet Take 5 mg by mouth daily.   [DISCONTINUED] azithromycin  (ZITHROMAX ) 250 MG tablet Take 2 on day one then 1 daily x 4 days   [DISCONTINUED] Black Pepper-Turmeric (TURMERIC CURCUMIN) 04-999 MG CAPS Take 1 capsule by mouth daily.   [DISCONTINUED] Coenzyme Q10 (COQ-10) 100 MG CAPS Take 1 capsule by mouth daily.   [DISCONTINUED] Glycopyrrolate -Formoterol  (BEVESPI  AEROSPHERE) 9-4.8 MCG/ACT AERO INHALE TWO PUFFS INTO THE LUNGS FIRST THING IN THE MORNING AND THEN ANOTHER TWO PUFFS ABOUT 12 HOURS LATER   [DISCONTINUED] tiZANidine (ZANAFLEX) 4 MG capsule Take 4 mg by mouth at bedtime.   No facility-administered encounter medications on file as of 10/06/2024.  Past Medical History:  Diagnosis Date   Arthritis    Rheumatoid   Bulging disc    Complication of anesthesia    pt states that he had a lot of back pain after spinal with hernia repair   GERD (gastroesophageal reflux disease)    Hypertension    Shortness of breath    Sleep apnea    STOP BANG score = 4    Past Surgical History:  Procedure Laterality Date   CHOLECYSTECTOMY  10/12/2012   Procedure:  LAPAROSCOPIC CHOLECYSTECTOMY;  Surgeon: Oneil DELENA Budge, MD;  Location: [facility];  Service: General;  Laterality: N/A;   ESOPHAGOGASTRODUODENOSCOPY  10/04/2012   Jenkins:gastritis, moderate   HERNIA REPAIR     Left foot repair     due to chain saw accident   LUMBAR SPINE SURGERY     PELVIC ABCESS DRAINAGE     TONSILLECTOMY      Family History  Problem Relation Age of Onset   Cancer Mother    Diabetes Mother    Cancer Father    Heart failure Sister    Heart disease Brother        stent placement   Liver cancer Brother        Objective    BP 135/69   Pulse 76   Ht 5' 9.5 (1.765 m)   Wt 190 lb (86.2 kg)   SpO2 100%   BMI 27.66 kg/m   Physical Exam  Gen: Chronically ill-appearing man, moderate frailty advanced age Neck: Normal thyroid , no nodules or adenopathy Heart: Regular, no murmur Lungs: Unlabored, clear throughout Abd: Soft, nontender, no organomegaly Ext: Warm, no edema, normal joints Back: Well-healed surgical scar over the lumbar spine Neuro: Alert, conversational, slow get up and go, hunched over gait, full strength upper and lower extremities Psych: Appropriate mood and affect, not anxious or depressed appearing     Assessment & Plan:   Problem List Items Addressed This Visit       High   Primary adrenocortical insufficiency (HCC) (Chronic)   Need to give more history on this issue.  Reports currently being on prednisone  10 mg by mouth daily for adrenal insufficiency.  There is cortisol testing in epic from 2014 that showed a ACTH  stim test with a 60-minute cortisol level maxing at 15.  This is consistent with adrenal insufficiency.  Has been on prednisone  for many many years, and was told that he could never come off of it.  Will continue this for now.  Blood pressure stable.  I recommended blood work today, patient declined saying that it was recently done with Dr. Bertell.  Will try to obtain those records.      Chronic pain syndrome - Primary (Chronic)    Chronic issue.  Chronic pain generator is low back pain due to osteoarthritis of the lumbar spine status post cervical fusion.  He has had chronic opioid therapy for many years, on disability for the last 5 years.  I reviewed the controlled database which confirms that he was using oxycodone  20 mg about 6 times daily, previously getting 180 tablets for a 1 month supply.  2 months ago with Dr. Rogenia decreased to 150 tablets for a 1 month supply.  Patient not able to tell me why the change.  He denies any adverse side effects, no symptoms of opioid use disorder.  He reports good benefit to the medication, says it improved his functional status.  He is worried that the opioid is causing him  increasing constipation and he is interested in a reduction in the dose to a 15 mg tablet.  I offered him referral to pain management, patient declined.  He has limited transportation and does not want to see more specialists.  Given his constipation, advancing age, frailty on exam, I recommended that we work the dose down to a max of 90 MME daily.  Oxycodone  equivalent dosing for this would be 15 mg 4 times daily.  Given the high doses of long time on the oxycodone  he is at risk for withdrawal.  Some going to decrease the dosing to oxycodone  15 mg with 150 tablets for a 1 month supply.  At follow-up visit we will decrease dose down to 120 tablets for 1 month supply as long as he is doing well.  Will check urine drug screen today.      Relevant Medications   amitriptyline (ELAVIL) 25 MG tablet   oxyCODONE  (ROXICODONE ) 15 MG immediate release tablet   Other Relevant Orders   POCT Urine Drug Screen (Completed)     Medium    Alpha-gal syndrome (Chronic)   Patient attributes many of his symptoms to alpha gal syndrome, he denies any recent hives or nocturnal symptoms.  He attributes his abdominal discomfort, constipation to alpha-gal.  He is diligent in avoiding mammalian meats, only eats chicken and fish.  He has not  worked with an Proofreader in the past.  No further treatment needed for this in my view, but I think we will need to do some gentle education about symptoms of alpha gal and symptoms that might be attributable to other things, like side effects from oxycodone .      Other Visit Diagnoses       Needs flu shot       Relevant Orders   Flu vaccine trivalent PF, 6mos and older(Flulaval,Afluria,Fluarix,Fluzone) (Completed)       Cleatus Debby Specking, MD

## 2024-10-06 NOTE — Assessment & Plan Note (Signed)
 Patient attributes many of his symptoms to alpha gal syndrome, he denies any recent hives or nocturnal symptoms.  He attributes his abdominal discomfort, constipation to alpha-gal.  He is diligent in avoiding mammalian meats, only eats chicken and fish.  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  No further treatment needed for this in my view, but I think we will need to do some gentle education about symptoms of alpha gal and symptoms that might be attributable to other things, like side effects from oxycodone .

## 2024-10-06 NOTE — Patient Instructions (Signed)
  VISIT SUMMARY: Today, we discussed your ongoing symptoms and reviewed your medications. We addressed your alpha-gal syndrome, chronic low back pain, rheumatoid arthritis, adrenal insufficiency, and chronic sore throat and dry mouth. We also discussed your concerns about medication side effects and pain management.  YOUR PLAN: -ALPHA-GAL SYNDROME WITH CHRONIC CONSTIPATION: Alpha-gal syndrome is an allergy to red meat and other products made from mammals. You have been experiencing chronic constipation and had a significant reaction to a bee sting. We will obtain your previous medical records from Dr. Bertell before deciding on further testing or treatment.  -CHRONIC LOW BACK PAIN STATUS POST LUMBAR FUSION ON CHRONIC OPIOID THERAPY: Chronic low back pain is persistent pain in the lower back. You have been managing this with oxycodone  and creams. We will prescribe oxycodone  15 mg, up to 4 times daily, with a maximum of 5 times daily. We will also refer you to a pain medicine clinic for further management and obtain a urine sample for monitoring.  -RHEUMATOID ARTHRITIS WITH CHRONIC JOINT PAIN: Rheumatoid arthritis is an autoimmune disease that causes joint pain and damage. You have significant pain in your knees, elbows, and hands, which affects your daily activities. Currently, you are managing this with pain pills, but we will consider other treatment options.  -ADRENAL INSUFFICIENCY ON CHRONIC PREDNISONE : Adrenal insufficiency is a condition where the adrenal glands do not produce enough hormones. You are managing this with chronic prednisone  therapy.  -CHRONIC SORE THROAT AND DRY MOUTH: Chronic sore throat and dry mouth can be caused by long-term use of reflux medication. You have difficulty swallowing and a persistent sore throat. We will monitor these symptoms and consider adjusting your medication if necessary.  INSTRUCTIONS: We will obtain your previous medical records from Dr. Bertell. You will be  referred to a pain medicine clinic for further management of your chronic low back pain. Please provide a urine sample for monitoring your medication use.

## 2024-11-07 ENCOUNTER — Encounter: Payer: Self-pay | Admitting: Student in an Organized Health Care Education/Training Program

## 2024-11-07 ENCOUNTER — Ambulatory Visit: Admitting: Student in an Organized Health Care Education/Training Program

## 2024-11-07 VITALS — BP 149/79 | HR 84 | Wt 190.4 lb

## 2024-11-07 DIAGNOSIS — M545 Low back pain, unspecified: Secondary | ICD-10-CM

## 2024-11-07 DIAGNOSIS — I10 Essential (primary) hypertension: Secondary | ICD-10-CM | POA: Diagnosis not present

## 2024-11-07 DIAGNOSIS — E271 Primary adrenocortical insufficiency: Secondary | ICD-10-CM | POA: Diagnosis not present

## 2024-11-07 DIAGNOSIS — G8929 Other chronic pain: Secondary | ICD-10-CM | POA: Diagnosis not present

## 2024-11-07 MED ORDER — PREDNISONE 10 MG PO TABS: 10.0000 mg | ORAL_TABLET | Freq: Every day | ORAL | 1 refills | Status: AC

## 2024-11-07 MED ORDER — OXYCODONE HCL 15 MG PO TABS: 15.0000 mg | ORAL_TABLET | Freq: Four times a day (QID) | ORAL | 0 refills | Status: DC | PRN

## 2024-11-07 NOTE — Assessment & Plan Note (Signed)
 Blood pressure elevated today at 149/79.  It was normal at her last visit.  Will recheck this again in 1 month.  Because of his underlying adrenal insufficiency I think there is some increased risk with starting antihypertensive in his case.

## 2024-11-07 NOTE — Progress Notes (Signed)
 Established Patient Office Visit  Patient ID: Tony Meadows, male    DOB: 07/03/1966  Age: 58 y.o. MRN: [id number] PCP: Jerrell Cleatus Ned, MD  Chief Complaint  Patient presents with   Pain    1 month follow up     Subjective:     HPI  Discussed the use of AI scribe software for clinical note transcription with the patient, who gave verbal consent to proceed.  History of Present Illness Tony Meadows is a 58 year old male with adrenal insufficiency who presents for management of chronic pain and medication refills.  He experiences chronic low back pain, which worsens in cold weather and affects his hands and feet. The pain is most severe in the morning, making it difficult for him to start his day. He takes Advil daily and oxycodone , which he finds helpful. He discussed insurance coverage for oxycodone  with his pharmacist and can purchase additional pills if needed.  He has a history of adrenal insufficiency diagnosed around 2015, managed with daily prednisone . Initially, an endocrinologist managed this condition, but due to financial constraints, he transitioned management to his family doctor.  He reports a persistent sore throat for nearly a year, described as a sensation running down the back of his throat, which he associates with stomach issues. He has a history of stomach trouble.  He is on disability and spends most of his days at home, occasionally walking his dog. He is raising his 54 year old granddaughter, who is working after graduating last year. No side effects from medications or recent falls.    Objective:     BP (!) 149/79   Pulse 84   Wt 190 lb 6.4 oz (86.4 kg)   SpO2 96%   BMI 27.71 kg/m   Physical Exam  Gen: Well-appearing man Neck: Normal thyroid , no nodules or adenopathy Heart: Regular, no murmur Lungs: Unlabored, clear throughout Ext: Warm, no edema    Assessment & Plan:   Problem List Items Addressed This Visit       High   Primary  adrenocortical insufficiency (HCC) (Chronic)   Patient reports a long history of adrenal insufficiency and has been on chronic prednisone  for over 10 years.  He reports having endocrinologist in [place] in the past, but I do not have those records.  He tells me he was told he would be on this medication for the rest of his life.  Will refill the prednisone .  Blood pressure is currently stable.  No symptoms of adrenal insufficiency.      Relevant Medications   predniSONE  (DELTASONE ) 10 MG tablet   Chronic low back pain - Primary (Chronic)   Chronic issue of osteoarthritis in the low back causing chronic low back pain.  He is on disability for this for over 5 years.  Has been on chronic opioids for longer than maps.  Previous patient of Dr. Bertell.  I agreed with continuing oxycodone  at our last visit, but at a maximum dose of oxycodone  15 mg 4 times daily which is 90 MME daily.  Patient did okay over the last month like that.  He is not totally satisfied, but says it is what it is.  He is in his normal functional state, mostly homebound and pretty isolated.  No signs or symptoms of withdrawal.  Last urine toxicology was appropriate.  I reviewed the controlled database which was appropriate.  We decided to continue with another month of oxycodone  15 mg #120 for 30-day supply.  Follow-up  with me in 1 month.      Relevant Medications   predniSONE  (DELTASONE ) 10 MG tablet   oxyCODONE  (ROXICODONE ) 15 MG immediate release tablet     Medium    Essential hypertension (Chronic)   Blood pressure elevated today at 149/79.  It was normal at her last visit.  Will recheck this again in 1 month.  Because of his underlying adrenal insufficiency I think there is some increased risk with starting antihypertensive in his case.       Return in about 4 weeks (around 12/05/2024).    Cleatus Debby Specking, MD [facility] at [facility]

## 2024-11-07 NOTE — Assessment & Plan Note (Signed)
 Chronic issue of osteoarthritis in the low back causing chronic low back pain.  He is on disability for this for over 5 years.  Has been on chronic opioids for longer than maps.  Previous patient of Dr. Bertell.  I agreed with continuing oxycodone  at our last visit, but at a maximum dose of oxycodone  15 mg 4 times daily which is 90 MME daily.  Patient did okay over the last month like that.  He is not totally satisfied, but says it is what it is.  He is in his normal functional state, mostly homebound and pretty isolated.  No signs or symptoms of withdrawal.  Last urine toxicology was appropriate.  I reviewed the controlled database which was appropriate.  We decided to continue with another month of oxycodone  15 mg #120 for 30-day supply.  Follow-up with me in 1 month.

## 2024-11-07 NOTE — Assessment & Plan Note (Signed)
 Patient reports a long history of adrenal insufficiency and has been on chronic prednisone  for over 10 years.  He reports having endocrinologist in [place] in the past, but I do not have those records.  He tells me he was told he would be on this medication for the rest of his life.  Will refill the prednisone .  Blood pressure is currently stable.  No symptoms of adrenal insufficiency.

## 2024-11-07 NOTE — Patient Instructions (Signed)
  VISIT SUMMARY: Today, we discussed the management of your chronic pain and adrenal insufficiency. We also reviewed your medications and addressed your persistent sore throat.  YOUR PLAN: -CHRONIC PAIN SYNDROME WITH LOW BACK PAIN: Chronic pain syndrome is a long-term pain condition that can affect various parts of the body. Your low back pain, which worsens in cold weather, is being managed with oxycodone  and Advil. We discussed the risks of daily Advil use, such as stomach ulcers and kidney problems. You should continue taking oxycodone  and Advil as needed and try to increase your physical activity as much as you can tolerate.  -PRIMARY ADRENOCORTICAL INSUFFICIENCY: Primary adrenocortical insufficiency is a condition where your adrenal glands do not produce enough hormones. This is managed with daily prednisone . You should continue taking your prednisone  every day and follow up with your primary care doctor for ongoing management.  INSTRUCTIONS: Please continue taking your medications as prescribed. Try to increase your physical activity as much as you can tolerate. Follow up with your primary care doctor for ongoing management of your adrenal insufficiency. If you have any new symptoms or concerns, please contact our office.

## 2024-11-13 ENCOUNTER — Encounter (INDEPENDENT_AMBULATORY_CARE_PROVIDER_SITE_OTHER): Payer: Self-pay | Admitting: *Deleted

## 2024-12-05 ENCOUNTER — Ambulatory Visit (INDEPENDENT_AMBULATORY_CARE_PROVIDER_SITE_OTHER): Admitting: Student in an Organized Health Care Education/Training Program

## 2024-12-05 ENCOUNTER — Encounter: Payer: Self-pay | Admitting: Student in an Organized Health Care Education/Training Program

## 2024-12-05 VITALS — BP 124/63 | HR 84 | Wt 191.0 lb

## 2024-12-05 DIAGNOSIS — G8929 Other chronic pain: Secondary | ICD-10-CM

## 2024-12-05 DIAGNOSIS — M545 Low back pain, unspecified: Secondary | ICD-10-CM

## 2024-12-05 DIAGNOSIS — E785 Hyperlipidemia, unspecified: Secondary | ICD-10-CM

## 2024-12-05 DIAGNOSIS — K219 Gastro-esophageal reflux disease without esophagitis: Secondary | ICD-10-CM

## 2024-12-05 MED ORDER — PANTOPRAZOLE SODIUM 40 MG PO TBEC: 40.0000 mg | DELAYED_RELEASE_TABLET | Freq: Two times a day (BID) | ORAL | 3 refills | Status: DC

## 2024-12-05 MED ORDER — OXYCODONE HCL 15 MG PO TABS: 15.0000 mg | ORAL_TABLET | Freq: Four times a day (QID) | ORAL | 0 refills | Status: DC | PRN

## 2024-12-05 MED ORDER — PANTOPRAZOLE SODIUM 40 MG PO TBEC: 40.0000 mg | DELAYED_RELEASE_TABLET | Freq: Every day | ORAL | 3 refills | Status: AC

## 2024-12-05 MED ORDER — AMITRIPTYLINE HCL 25 MG PO TABS: 25.0000 mg | ORAL_TABLET | Freq: Every day | ORAL | 1 refills | Status: AC

## 2024-12-05 NOTE — Progress Notes (Signed)
 Established Patient Office Visit  Patient ID: Tony Meadows, male    DOB: [birth year]  Age: 58 y.o. MRN: [id number] PCP: Jerrell Cleatus Ned, MD  Chief Complaint  Patient presents with   Medical Management of Chronic Issues    4 week follow up     Subjective:     HPI  Discussed the use of AI scribe software for clinical note transcription with the patient, who gave verbal consent to proceed.  History of Present Illness Tony Meadows is a 58 year old male with chronic low back pain and arthritis who presents for medication management and follow-up.  He experienced a recent fall on ice while carrying water , resulting in temporary pain but no fractures. He felt soreness for a day or two but is feeling better now.  He is currently walking about half a mile a day, which he finds beneficial for his overall well-being.  He uses oxycodone  for pain management, which he finds effective without significant side effects. He also takes Advil to manage pain when he runs low on oxycodone . He is using Protonix  (pantoprazole ) for stomach protection due to reflux.  He describes persistent pain in his low back and hands. 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 He was previously diagnosed with mild arthritis by a rheumatologist, which he finds surprising given the severity of his symptoms.  No mood disturbances such as depression or anxiety, although he mentions feeling lonely since his granddaughter left a couple of months ago.  He has not quit smoking despite previous discussions about it.     Objective:     BP 124/63   Pulse 84   Wt 191 lb (86.6 kg)   SpO2 97%   BMI 27.80 kg/m   Physical Exam  Gen: Well-appearing man Neck: Normal thyroid , no nodules or adenopathy Heart: Regular, no murmur Lungs: Unlabored, clear throughout Ext: Warm, tender to the touch on the anterior shins but no edema MSK: Diffuse moderate osteoarthritis in hands, knees, stiff lower  back.    Assessment & Plan:   Problem List Items Addressed This Visit       High   Chronic low back pain - Primary (Chronic)   Difficult situation of a person with chronic low back pain and discomfort in other joints like his hands, knees, and lower legs, was been using high doses of short acting opioids for many years.  He still relatively new to me.  We tapered his dose of oxycodone  down to 90 MME, and he tolerated that pretty well.  Still reports that the oxycodone  is helpful for his functioning.  Denies any adverse side effects.  I reviewed the database which was appropriate.  Drug testing in October was appropriate.  He is following up with me closely every 4 weeks for now.  He has had evaluation for rheumatologic causes of his discomfort, which only showed mild osteoarthritis.      Relevant Medications   oxyCODONE  (ROXICODONE ) 15 MG immediate release tablet   amitriptyline  (ELAVIL ) 25 MG tablet     Medium    GERD (gastroesophageal reflux disease) (Chronic)   Chronic issue.  Using Protonix .  After we decreased the oxycodone  dosage he started using NSAIDs on a more regular basis.  I warned him against this as it could cause peptic ulcer disease or worsening reflux.  Will continue with Protonix  once daily which will offer some protection.      Relevant Medications  pantoprazole  (PROTONIX ) 40 MG tablet   Dyslipidemia (Chronic)   Chronic issue.  Currently using atorvastatin.  I asked him to check blood work today to look at lipids, he declined, not in a fasting state.  Would like to check at next visit.       Return in about 4 weeks (around 01/02/2025).    Cleatus Debby Specking, MD [facility] at [facility]

## 2024-12-05 NOTE — Assessment & Plan Note (Signed)
 Chronic issue.  Currently using atorvastatin.  I asked him to check blood work today to look at lipids, he declined, not in a fasting state.  Would like to check at next visit.

## 2024-12-05 NOTE — Assessment & Plan Note (Signed)
 Difficult situation of a person with chronic low back pain and discomfort in other joints like his hands, knees, and lower legs, was been using high doses of short acting opioids for many years.  He still relatively new to me.  We tapered his dose of oxycodone  down to 90 MME, and he tolerated that pretty well.  Still reports that the oxycodone  is helpful for his functioning.  Denies any adverse side effects.  I reviewed the database which was appropriate.  Drug testing in October was appropriate.  He is following up with me closely every 4 weeks for now.  He has had evaluation for rheumatologic causes of his discomfort, which only showed mild osteoarthritis.

## 2024-12-05 NOTE — Assessment & Plan Note (Signed)
 Chronic issue.  Using Protonix .  After we decreased the oxycodone  dosage he started using NSAIDs on a more regular basis.  I warned him against this as it could cause peptic ulcer disease or worsening reflux.  Will continue with Protonix  once daily which will offer some protection.

## 2024-12-05 NOTE — Patient Instructions (Signed)
°  VISIT SUMMARY: Today, you came in for a follow-up appointment to manage your chronic low back pain and arthritis. We also reviewed your medication regimen and overall health.  YOUR PLAN: -CHRONIC LOW BACK PAIN DUE TO OSTEOARTHRITIS: Chronic low back pain due to osteoarthritis means that the cartilage in your joints has worn down over time, causing pain and stiffness. You will continue taking oxycodone  15 mg orally, four times daily as needed for pain management. Your prescription has been refilled and will be ready for pickup on Thursday. Keep up with your walking routine to help maintain mobility.  -ESSENTIAL HYPERTENSION: Essential hypertension is high blood pressure with no identifiable cause. Your blood pressure is well-controlled with your current medication, so you should continue with your current antihypertensive regimen.  INSTRUCTIONS: Your prescription for oxycodone  will be ready for pickup on Thursday. Continue your current medications and walking routine. If you experience any new symptoms or have concerns, please schedule a follow-up appointment.

## 2025-01-02 ENCOUNTER — Encounter: Payer: Self-pay | Admitting: Student in an Organized Health Care Education/Training Program

## 2025-01-02 ENCOUNTER — Ambulatory Visit: Admitting: Student in an Organized Health Care Education/Training Program

## 2025-01-02 VITALS — BP 142/74 | HR 71 | Temp 98.2°F | Ht 69.5 in | Wt 193.4 lb

## 2025-01-02 DIAGNOSIS — M545 Low back pain, unspecified: Secondary | ICD-10-CM | POA: Diagnosis not present

## 2025-01-02 DIAGNOSIS — G8929 Other chronic pain: Secondary | ICD-10-CM

## 2025-01-02 DIAGNOSIS — I10 Essential (primary) hypertension: Secondary | ICD-10-CM

## 2025-01-02 DIAGNOSIS — E785 Hyperlipidemia, unspecified: Secondary | ICD-10-CM | POA: Diagnosis not present

## 2025-01-02 DIAGNOSIS — J029 Acute pharyngitis, unspecified: Secondary | ICD-10-CM | POA: Insufficient documentation

## 2025-01-02 DIAGNOSIS — E271 Primary adrenocortical insufficiency: Secondary | ICD-10-CM

## 2025-01-02 LAB — POCT URINE DRUG SCREEN
Methylenedioxyamphetamine: NOT DETECTED
POC Amphetamine UR: NOT DETECTED
POC BENZODIAZEPINES UR: NOT DETECTED
POC Barbiturate UR: NOT DETECTED
POC Cocaine UR: NOT DETECTED
POC DRUG SCREEN OXIDANTS URINE: NORMAL
POC Ecstasy UR: NOT DETECTED
POC Marijuana UR: NOT DETECTED
POC Methadone UR: NOT DETECTED
POC Methamphetamine UR: NOT DETECTED
POC Opiate Ur: NOT DETECTED
POC Oxycodone UR: POSITIVE — AB
POC PH URINE: NORMAL
POC PHENCYCLIDINE UR: NOT DETECTED
POC SPECIFIC GRAVITY URINE: NORMAL
POC TRICYCLICS UR: NOT DETECTED
Temperature: 94 Cel

## 2025-01-02 LAB — BASIC METABOLIC PANEL WITH GFR
BUN: 16 mg/dL (ref 6–23)
CO2: 32 meq/L (ref 19–32)
Calcium: 9.4 mg/dL (ref 8.4–10.5)
Chloride: 104 meq/L (ref 96–112)
Creatinine, Ser: 0.93 mg/dL (ref 0.40–1.50)
GFR: 89 mL/min
Glucose, Bld: 86 mg/dL (ref 70–99)
Potassium: 4.9 meq/L (ref 3.5–5.1)
Sodium: 141 meq/L (ref 135–145)

## 2025-01-02 LAB — LIPID PANEL
Cholesterol: 156 mg/dL (ref 28–200)
HDL: 47.1 mg/dL
LDL Cholesterol: 76 mg/dL (ref 10–99)
NonHDL: 108.54
Total CHOL/HDL Ratio: 3
Triglycerides: 161 mg/dL — ABNORMAL HIGH (ref 10.0–149.0)
VLDL: 32.2 mg/dL (ref 0.0–40.0)

## 2025-01-02 LAB — MICROALBUMIN / CREATININE URINE RATIO
Creatinine,U: 226.8 mg/dL
Microalb Creat Ratio: 4.9 mg/g (ref 0.0–30.0)
Microalb, Ur: 1.1 mg/dL (ref 0.7–1.9)

## 2025-01-02 LAB — HEMOGLOBIN A1C: Hgb A1c MFr Bld: 5.4 % (ref 4.6–6.5)

## 2025-01-02 LAB — POCT RAPID STREP A (OFFICE): Rapid Strep A Screen: NEGATIVE

## 2025-01-02 MED ORDER — OXYCODONE HCL 15 MG PO TABS: 15.0000 mg | ORAL_TABLET | Freq: Four times a day (QID) | ORAL | 0 refills | Status: AC | PRN

## 2025-01-02 NOTE — Assessment & Plan Note (Signed)
 Chronic and stable.  Blood pressure today is acceptable at 142/74.  A little bit of a recent upper respiratory tract infection but did not seem to need increased doses of steroids through it.  Because of the chronic exposure to prednisone , he is at increased risk of hyperglycemia/diabetes, so we will check an A1c today.

## 2025-01-02 NOTE — Assessment & Plan Note (Signed)
 Chronic and stable.  Blood pressure today 142/74.  Borderline hypertension in the setting of someone who also has adrenal insufficiency.  Because of that predisposition to low blood pressures I am hesitant to start antihypertensive medications.  Will check labs today and urine microalbumin.  If worsening any changes in renal function or proteinuria, that would lead me to start ARB therapy.

## 2025-01-02 NOTE — Patient Instructions (Signed)
" °  VISIT SUMMARY: During your visit, we discussed your sore throat, difficulty swallowing, and hoarseness, which have been troubling you for the past three weeks. We also addressed the pain in your lower gums related to your dentures and reviewed your chronic low back pain management. Routine blood work was ordered as part of your general health maintenance.  YOUR PLAN: -ACUTE PHARYNGITIS: Acute pharyngitis is an inflammation of the throat that can cause pain and difficulty swallowing. We performed a throat swab to test for a streptococcal infection. If the test is positive, we will start you on antibiotics. If the test is negative, we will manage it as a viral infection.  -DENTURE-RELATED ORAL MUCOSAL INJURY: The pain in your lower gums is likely due to an injury from your dentures. We referred you to a dentist to evaluate and adjust your dentures to prevent further injury.  -CHRONIC LOW BACK PAIN: Chronic low back pain is long-term pain in the lower back. Your pain is currently managed with oxycodone , and we have refilled your prescription. Please continue to take your medication as directed and report any side effects.  -GENERAL HEALTH MAINTENANCE: Routine blood work is important to monitor your overall health. We have ordered the necessary blood tests for you.  INSTRUCTIONS: Please follow up with the dentist for your denture evaluation and adjustment. We will contact you with the results of your throat swab. Continue taking your medications as prescribed and complete your routine blood work as ordered.    "

## 2025-01-02 NOTE — Progress Notes (Signed)
 "  Established Patient Office Visit  Patient ID: Tony Meadows, male    DOB: 06/24/1966  Age: 59 y.o. MRN: [id number] PCP: Jerrell Cleatus Ned, MD  Chief Complaint  Patient presents with   Follow-up    Patient states 3 weeks he has had chronic sore throat, no fever no chills.     Subjective:     HPI  Discussed the use of AI scribe software for clinical note transcription with the patient, who gave verbal consent to proceed.  History of Present Illness SEVERIN Meadows is a 59 year old male with alpha-gal syndrome who presents with a sore throat and difficulty swallowing.  For the past three weeks, he has experienced a sore throat that has made swallowing and drinking difficult. The pain is described as 'unmercifully' painful, and his voice has become hoarse. He has used a humidifier and taken sinus and allergy medications without relief. No fever, nasal or sinus congestion, or ear pain. He coughs when he first lies down.  He has a history of alpha-gal syndrome, which has previously been associated with a mild sore throat, but the current symptoms are more severe.  He wears dentures and reports that for the past month, the tissue on his lower gums has peeled off, causing significant pain described as 'having six toothaches at one time.'  He is currently taking oxycodone , with a recent prescription of 120 tablets filled on December 16, lasting for 30 days, equating to 90 morphine milliequivalents per day. He also uses a 'big bottle of Advil.'     Objective:     BP (!) 142/74   Pulse 71   Temp 98.2 F (36.8 C) (Oral)   Ht 5' 9.5 (1.765 m)   Wt 193 lb 6.4 oz (87.7 kg)   SpO2 97%   BMI 28.15 kg/m   Physical Exam  Gen: Well-appearing man, voice is mildly hoarse Ears: Bilateral tympanic membranes mildly erythematous but no middle ear effusion Mouth: Moderate erythema in the posterior oropharynx, absent tonsils, no exudate Neck: No tender cervical adenopathy Heart: Regular, no  murmur Lungs: Unlabored, clear throughout Ext: Warm, no edema    Assessment & Plan:   Problem List Items Addressed This Visit       High   Primary adrenocortical insufficiency (HCC) (Chronic)   Chronic and stable.  Blood pressure today is acceptable at 142/74.  A little bit of a recent upper respiratory tract infection but did not seem to need increased doses of steroids through it.  Because of the chronic exposure to prednisone , he is at increased risk of hyperglycemia/diabetes, so we will check an A1c today.      Relevant Orders   Hemoglobin A1c   Chronic low back pain (Chronic)   Chronic issue of lower back pain due to osteoarthritis.  Has been using high doses of oxycodone  for many years with Dr. Bertell.  Tolerated decreasing daily dose to oxycodone  15 mg 4 times daily.  This is 90 morphine mill equivalents per day, the maximum that I am willing to prescribe.  He reports that this is still helping him be functional.  Denies any adverse side effects.  I reviewed the database which was appropriate.  Urine drug test done today and was appropriate.  I provided a refill of the oxycodone  and I will see him back in the clinic in 2 months.      Relevant Medications   oxyCODONE  (ROXICODONE ) 15 MG immediate release tablet   Other Relevant Orders  POCT Urine Drug Screen (Completed)     Medium    Essential hypertension (Chronic)   Chronic and stable.  Blood pressure today 142/74.  Borderline hypertension in the setting of someone who also has adrenal insufficiency.  Because of that predisposition to low blood pressures I am hesitant to start antihypertensive medications.  Will check labs today and urine microalbumin.  If worsening any changes in renal function or proteinuria, that would lead me to start ARB therapy.      Relevant Orders   Basic metabolic panel with GFR   Microalbumin / creatinine urine ratio   Dyslipidemia (Chronic)   Relevant Orders   Lipid panel     Unprioritized    Pharyngitis - Primary   Acute issue of pharyngitis over the last 3 weeks.  Has a history of prolonged pharyngitis, tonsillectomy in the past.  No high risk features on exam today, no fevers.  Point-of-care strep testing was negative.  Doing well at home, eating and drinking well, I doubt that this is peritonsillar abscess or other deep neck space infection based on reassuring exam.  I recommended continuing supportive care.  No benefit to antibiotics, likely viral.      Relevant Orders   POCT rapid strep A (Completed)    Return in about 2 months (around 03/02/2025).    Cleatus Debby Specking, MD [facility] at [facility]   "

## 2025-01-02 NOTE — Assessment & Plan Note (Signed)
 Chronic issue of lower back pain due to osteoarthritis.  Has been using high doses of oxycodone  for many years with Dr. Bertell.  Tolerated decreasing daily dose to oxycodone  15 mg 4 times daily.  This is 90 morphine mill equivalents per day, the maximum that I am willing to prescribe.  He reports that this is still helping him be functional.  Denies any adverse side effects.  I reviewed the database which was appropriate.  Urine drug test done today and was appropriate.  I provided a refill of the oxycodone  and I will see him back in the clinic in 2 months.

## 2025-01-02 NOTE — Assessment & Plan Note (Signed)
 Acute issue of pharyngitis over the last 3 weeks.  Has a history of prolonged pharyngitis, tonsillectomy in the past.  No high risk features on exam today, no fevers.  Point-of-care strep testing was negative.  Doing well at home, eating and drinking well, I doubt that this is peritonsillar abscess or other deep neck space infection based on reassuring exam.  I recommended continuing supportive care.  No benefit to antibiotics, likely viral.

## 2025-01-03 ENCOUNTER — Ambulatory Visit: Payer: Self-pay | Admitting: Student in an Organized Health Care Education/Training Program

## 2025-03-02 ENCOUNTER — Ambulatory Visit: Admitting: Student in an Organized Health Care Education/Training Program
